# Patient Record
Sex: Female | Born: 1960
Health system: Southern US, Community
[De-identification: ages and names within clinical notes are randomized; demographics above are authoritative.]

## PROBLEM LIST (undated history)

## (undated) DIAGNOSIS — R928 Other abnormal and inconclusive findings on diagnostic imaging of breast: Secondary | ICD-10-CM

## (undated) DIAGNOSIS — E7800 Pure hypercholesterolemia, unspecified: Secondary | ICD-10-CM

## (undated) DIAGNOSIS — F329 Major depressive disorder, single episode, unspecified: Secondary | ICD-10-CM

## (undated) DIAGNOSIS — F3289 Other specified depressive episodes: Secondary | ICD-10-CM

## (undated) DIAGNOSIS — F17200 Nicotine dependence, unspecified, uncomplicated: Secondary | ICD-10-CM

## (undated) DIAGNOSIS — R030 Elevated blood-pressure reading, without diagnosis of hypertension: Secondary | ICD-10-CM

## (undated) DIAGNOSIS — D259 Leiomyoma of uterus, unspecified: Secondary | ICD-10-CM

## (undated) HISTORY — PX: BUNIONECTOMY: SHX129

## (undated) HISTORY — DX: Other abnormal and inconclusive findings on diagnostic imaging of breast: R92.8

## (undated) HISTORY — DX: Leiomyoma of uterus, unspecified: D25.9

## (undated) HISTORY — DX: Major depressive disorder, single episode, unspecified: F32.9

## (undated) HISTORY — DX: Pure hypercholesterolemia, unspecified: E78.00

## (undated) HISTORY — DX: Nicotine dependence, unspecified, uncomplicated: F17.200

## (undated) HISTORY — DX: Other specified depressive episodes: F32.89

## (undated) HISTORY — DX: Elevated blood-pressure reading, without diagnosis of hypertension: R03.0

## (undated) HISTORY — PX: PARTIAL HYSTERECTOMY: SHX80

## (undated) HISTORY — PX: BLADDER SUSPENSION: SHX72

---

## 2002-07-22 ENCOUNTER — Encounter (INDEPENDENT_AMBULATORY_CARE_PROVIDER_SITE_OTHER): Payer: Self-pay | Admitting: Internal Medicine

## 2002-07-22 ENCOUNTER — Other Ambulatory Visit: Admission: RE | Admit: 2002-07-22 | Discharge: 2002-07-22 | Payer: Self-pay | Admitting: Family Medicine

## 2003-12-03 ENCOUNTER — Ambulatory Visit: Payer: Self-pay | Admitting: Family Medicine

## 2003-12-15 ENCOUNTER — Ambulatory Visit: Payer: Self-pay | Admitting: Family Medicine

## 2004-02-10 ENCOUNTER — Ambulatory Visit: Payer: Self-pay | Admitting: Family Medicine

## 2004-02-15 ENCOUNTER — Ambulatory Visit: Payer: Self-pay | Admitting: Internal Medicine

## 2004-03-22 ENCOUNTER — Ambulatory Visit: Payer: Self-pay | Admitting: *Deleted

## 2004-04-05 ENCOUNTER — Ambulatory Visit: Payer: Self-pay | Admitting: Cardiovascular Disease

## 2004-06-27 ENCOUNTER — Ambulatory Visit: Payer: Self-pay | Admitting: Cardiology

## 2004-08-16 ENCOUNTER — Ambulatory Visit: Payer: Self-pay | Admitting: *Deleted

## 2004-08-16 ENCOUNTER — Ambulatory Visit: Payer: Self-pay | Admitting: Cardiology

## 2004-10-13 ENCOUNTER — Ambulatory Visit: Payer: Self-pay | Admitting: Family Medicine

## 2004-11-15 ENCOUNTER — Ambulatory Visit: Payer: Self-pay | Admitting: Cardiology

## 2004-12-02 ENCOUNTER — Ambulatory Visit: Payer: Self-pay | Admitting: Family Medicine

## 2005-06-12 ENCOUNTER — Ambulatory Visit: Payer: Self-pay | Admitting: Family Medicine

## 2005-06-13 ENCOUNTER — Ambulatory Visit: Payer: Self-pay | Admitting: Family Medicine

## 2005-07-11 ENCOUNTER — Encounter: Payer: Self-pay | Admitting: Family Medicine

## 2005-07-11 ENCOUNTER — Ambulatory Visit: Payer: Self-pay | Admitting: Family Medicine

## 2005-08-29 ENCOUNTER — Ambulatory Visit: Payer: Self-pay | Admitting: Family Medicine

## 2006-01-23 HISTORY — PX: VAGINAL HYSTERECTOMY: SUR661

## 2006-02-20 ENCOUNTER — Ambulatory Visit: Payer: Self-pay | Admitting: Family Medicine

## 2006-03-12 ENCOUNTER — Ambulatory Visit: Payer: Self-pay | Admitting: Gynecology

## 2006-03-13 ENCOUNTER — Ambulatory Visit: Payer: Self-pay | Admitting: Family Medicine

## 2006-03-15 ENCOUNTER — Ambulatory Visit: Payer: Self-pay | Admitting: Gynecology

## 2006-03-20 ENCOUNTER — Ambulatory Visit (HOSPITAL_COMMUNITY): Admission: RE | Admit: 2006-03-20 | Discharge: 2006-03-20 | Payer: Self-pay | Admitting: Gynecology

## 2006-05-15 ENCOUNTER — Ambulatory Visit: Payer: Self-pay | Admitting: Family Medicine

## 2006-06-15 ENCOUNTER — Ambulatory Visit: Payer: Self-pay | Admitting: Internal Medicine

## 2006-06-15 DIAGNOSIS — F3289 Other specified depressive episodes: Secondary | ICD-10-CM | POA: Insufficient documentation

## 2006-06-15 DIAGNOSIS — F329 Major depressive disorder, single episode, unspecified: Secondary | ICD-10-CM | POA: Insufficient documentation

## 2006-07-16 ENCOUNTER — Telehealth (INDEPENDENT_AMBULATORY_CARE_PROVIDER_SITE_OTHER): Payer: Self-pay | Admitting: *Deleted

## 2006-08-15 ENCOUNTER — Encounter (INDEPENDENT_AMBULATORY_CARE_PROVIDER_SITE_OTHER): Payer: Self-pay | Admitting: Internal Medicine

## 2006-08-15 DIAGNOSIS — F17200 Nicotine dependence, unspecified, uncomplicated: Secondary | ICD-10-CM | POA: Insufficient documentation

## 2006-08-20 ENCOUNTER — Ambulatory Visit: Payer: Self-pay | Admitting: Family Medicine

## 2006-08-20 DIAGNOSIS — L909 Atrophic disorder of skin, unspecified: Secondary | ICD-10-CM | POA: Insufficient documentation

## 2006-08-20 DIAGNOSIS — L919 Hypertrophic disorder of the skin, unspecified: Secondary | ICD-10-CM

## 2006-08-21 ENCOUNTER — Encounter: Payer: Self-pay | Admitting: Family Medicine

## 2006-08-21 DIAGNOSIS — R928 Other abnormal and inconclusive findings on diagnostic imaging of breast: Secondary | ICD-10-CM | POA: Insufficient documentation

## 2006-10-09 ENCOUNTER — Encounter: Payer: Self-pay | Admitting: Family Medicine

## 2006-10-09 ENCOUNTER — Ambulatory Visit: Payer: Self-pay | Admitting: Family Medicine

## 2006-10-12 ENCOUNTER — Encounter (INDEPENDENT_AMBULATORY_CARE_PROVIDER_SITE_OTHER): Payer: Self-pay | Admitting: *Deleted

## 2007-02-22 ENCOUNTER — Ambulatory Visit: Payer: Self-pay | Admitting: Family Medicine

## 2007-02-22 DIAGNOSIS — N951 Menopausal and female climacteric states: Secondary | ICD-10-CM | POA: Insufficient documentation

## 2007-02-22 DIAGNOSIS — R1031 Right lower quadrant pain: Secondary | ICD-10-CM | POA: Insufficient documentation

## 2007-02-22 DIAGNOSIS — I10 Essential (primary) hypertension: Secondary | ICD-10-CM | POA: Insufficient documentation

## 2007-02-22 DIAGNOSIS — R030 Elevated blood-pressure reading, without diagnosis of hypertension: Secondary | ICD-10-CM | POA: Insufficient documentation

## 2007-02-25 ENCOUNTER — Ambulatory Visit: Payer: Self-pay | Admitting: Family Medicine

## 2007-02-26 ENCOUNTER — Ambulatory Visit: Payer: Self-pay | Admitting: Cardiovascular Disease

## 2007-02-26 LAB — CONVERTED CEMR LAB
ALT: 15 units/L (ref 0–35)
AST: 13 units/L (ref 0–37)
Chloride: 104 meq/L (ref 96–112)
Cholesterol: 291 mg/dL (ref 0–200)
Creatinine, Ser: 0.9 mg/dL (ref 0.4–1.2)
FSH: 11.3 milliintl units/mL
GFR calc Af Amer: 87 mL/min
GFR calc non Af Amer: 72 mL/min
Glucose, Bld: 107 mg/dL — ABNORMAL HIGH (ref 70–99)
HCT: 40.9 % (ref 36.0–46.0)
HDL: 28.4 mg/dL — ABNORMAL LOW (ref >39.0)
MCHC: 33.4 g/dL (ref 30.0–36.0)
MCV: 89.8 fL (ref 78.0–100.0)
Monocytes Relative: 8.5 % (ref 3.0–11.0)
Neutro Abs: 5.1 10*3/uL (ref 1.4–7.7)
Neutrophils Relative %: 52.3 % (ref 43.0–77.0)
Platelets: 300 10*3/uL (ref 150–400)
RBC: 4.55 M/uL (ref 3.87–5.11)
RDW: 12.8 % (ref 11.5–14.6)
TSH: 2.73 microintl units/mL (ref 0.35–5.50)
Total CHOL/HDL Ratio: 10.2
Total Protein: 6.6 g/dL (ref 6.0–8.3)
Triglycerides: 613 mg/dL (ref 0–149)

## 2007-03-01 ENCOUNTER — Telehealth (INDEPENDENT_AMBULATORY_CARE_PROVIDER_SITE_OTHER): Payer: Self-pay | Admitting: Internal Medicine

## 2007-03-01 ENCOUNTER — Encounter (INDEPENDENT_AMBULATORY_CARE_PROVIDER_SITE_OTHER): Payer: Self-pay | Admitting: Internal Medicine

## 2007-03-01 DIAGNOSIS — D259 Leiomyoma of uterus, unspecified: Secondary | ICD-10-CM | POA: Insufficient documentation

## 2007-03-05 ENCOUNTER — Encounter (INDEPENDENT_AMBULATORY_CARE_PROVIDER_SITE_OTHER): Payer: Self-pay | Admitting: *Deleted

## 2007-03-25 ENCOUNTER — Encounter (INDEPENDENT_AMBULATORY_CARE_PROVIDER_SITE_OTHER): Payer: Self-pay | Admitting: Gynecology

## 2007-03-25 ENCOUNTER — Ambulatory Visit: Payer: Self-pay | Admitting: Gynecology

## 2007-03-26 ENCOUNTER — Telehealth (INDEPENDENT_AMBULATORY_CARE_PROVIDER_SITE_OTHER): Payer: Self-pay | Admitting: Internal Medicine

## 2007-05-21 ENCOUNTER — Encounter (INDEPENDENT_AMBULATORY_CARE_PROVIDER_SITE_OTHER): Payer: Self-pay | Admitting: Gynecology

## 2007-05-21 ENCOUNTER — Ambulatory Visit (HOSPITAL_COMMUNITY): Admission: RE | Admit: 2007-05-21 | Discharge: 2007-05-22 | Payer: Self-pay | Admitting: Gynecology

## 2007-05-21 ENCOUNTER — Ambulatory Visit: Payer: Self-pay | Admitting: Gynecology

## 2007-05-27 ENCOUNTER — Ambulatory Visit: Payer: Self-pay | Admitting: Gynecology

## 2007-07-01 ENCOUNTER — Ambulatory Visit: Payer: Self-pay | Admitting: Gynecology

## 2007-09-11 ENCOUNTER — Telehealth (INDEPENDENT_AMBULATORY_CARE_PROVIDER_SITE_OTHER): Payer: Self-pay | Admitting: Internal Medicine

## 2007-09-20 ENCOUNTER — Encounter (INDEPENDENT_AMBULATORY_CARE_PROVIDER_SITE_OTHER): Payer: Self-pay | Admitting: Internal Medicine

## 2007-09-20 ENCOUNTER — Ambulatory Visit: Payer: Self-pay | Admitting: Family Medicine

## 2007-09-24 ENCOUNTER — Encounter (INDEPENDENT_AMBULATORY_CARE_PROVIDER_SITE_OTHER): Payer: Self-pay | Admitting: *Deleted

## 2007-09-24 ENCOUNTER — Encounter (INDEPENDENT_AMBULATORY_CARE_PROVIDER_SITE_OTHER): Payer: Self-pay | Admitting: Internal Medicine

## 2008-10-27 ENCOUNTER — Telehealth (INDEPENDENT_AMBULATORY_CARE_PROVIDER_SITE_OTHER): Payer: Self-pay | Admitting: Internal Medicine

## 2008-11-20 ENCOUNTER — Ambulatory Visit: Payer: Self-pay | Admitting: Family Medicine

## 2008-11-20 ENCOUNTER — Encounter (INDEPENDENT_AMBULATORY_CARE_PROVIDER_SITE_OTHER): Payer: Self-pay | Admitting: Internal Medicine

## 2008-11-25 ENCOUNTER — Encounter (INDEPENDENT_AMBULATORY_CARE_PROVIDER_SITE_OTHER): Payer: Self-pay | Admitting: *Deleted

## 2009-02-08 ENCOUNTER — Ambulatory Visit: Payer: Self-pay | Admitting: Obstetrics and Gynecology

## 2009-02-08 ENCOUNTER — Encounter: Payer: Self-pay | Admitting: Family Medicine

## 2009-02-08 LAB — CONVERTED CEMR LAB: GC Probe Amp, Urine: NEGATIVE

## 2009-04-30 ENCOUNTER — Ambulatory Visit: Payer: Self-pay | Admitting: Family Medicine

## 2009-04-30 ENCOUNTER — Telehealth: Payer: Self-pay | Admitting: Family Medicine

## 2009-04-30 DIAGNOSIS — N390 Urinary tract infection, site not specified: Secondary | ICD-10-CM | POA: Insufficient documentation

## 2009-04-30 LAB — CONVERTED CEMR LAB
Bilirubin Urine: NEGATIVE
Casts: 0 /lpf
Glucose, Urine, Semiquant: NEGATIVE
Ketones, urine, test strip: NEGATIVE
Nitrite: NEGATIVE
Specific Gravity, Urine: 1.02
Urobilinogen, UA: 0.2
Yeast, UA: 0

## 2009-11-11 ENCOUNTER — Telehealth: Payer: Self-pay | Admitting: Family Medicine

## 2009-12-27 ENCOUNTER — Ambulatory Visit: Payer: Self-pay | Admitting: Family Medicine

## 2009-12-27 ENCOUNTER — Encounter: Payer: Self-pay | Admitting: Family Medicine

## 2009-12-27 LAB — HM MAMMOGRAPHY: HM Mammogram: NORMAL

## 2009-12-28 ENCOUNTER — Encounter (INDEPENDENT_AMBULATORY_CARE_PROVIDER_SITE_OTHER): Payer: Self-pay | Admitting: *Deleted

## 2010-02-22 NOTE — Miscellaneous (Signed)
  Clinical Lists Changes  Observations: Added new observation of MAMMO DUE: 12/28/2010 (12/27/2009 16:44) Added new observation of MAMMOGRAM: Normal (12/27/2009 16:44)

## 2010-02-22 NOTE — Letter (Signed)
Summary: Results Follow up Letter  [facility]  [address] [place], [place] [address]   Phone: [phone]  Fax: [phone]    12/28/2009 MRN: [id number]    [name] [address] [place], [place]  [address]    Dear Ms. Brekke,  The following are the results of your recent test(s):  Test         Result    Pap Smear:        Normal _____  Not Normal _____ Comments: ______________________________________________________ Cholesterol: LDL(Bad cholesterol):         Your goal is less than:         HDL (Good cholesterol):       Your goal is more than: Comments:  ______________________________________________________ Mammogram:        Normal __X___  Not Normal _____ Comments:  Yearly follow up is recommended.   ___________________________________________________________________ Hemoccult:        Normal _____  Not normal _______ Comments:    _____________________________________________________________________ Other Tests:    We routinely do not discuss normal results over the telephone.  If you desire a copy of the results, or you have any questions about this information we can discuss them at your next office visit.   Sincerely,    Dwana Curd. Para March, M.D.  [facility]

## 2010-02-22 NOTE — Progress Notes (Signed)
Summary: ? UTI  Phone Note Call from Patient Call back at Home Phone [phone]   Caller: Patient Call For: Dr. Milinda Antis Summary of Call: Patient is complaining of pressure upon urination, no pain, no burning.  She called on yesterday then decided to just drink lots of water and Cranberry juice to see if that helped and it did not.  Wants to know if she needs to be seen or if she can just come by and drop off a urine specimen.  Please advise.  Uses [facility] Initial call taken by: Linde Gillis CMA Duncan Dull),  April 30, 2009 9:19 AM  Follow-up for Phone Call        please drop off specimen when able- alert me when it gets processed thanks  Follow-up by: Judith Part MD,  April 30, 2009 10:20 AM  Additional Follow-up for Phone Call Additional follow up Details #1::        Patient notified as instructed by telephone.  Pt will be here today before 4:30pm.Rena Isley LPN  April 30, 4780 1:01 PM   Pt got urine specimen and said still no pain; just pressure feeling but when finishes urinating still feels like needs to urinate more. Pt can be reached at [phone] and [facility] if needed.Lewanda Rife LPN  April 30, 9560 4:18 PM   New Problems: UTI (ICD-599.0)   Additional Follow-up for Phone Call Additional follow up Details #2::    Patient notified as instructed by telephone. Medication phoned to [facility] as instructed. Lewanda Rife LPN  May 01, 1306 4:39 PM   New Problems: UTI (ICD-599.0) New/Updated Medications: SEPTRA DS 800-160 MG TABS (SULFAMETHOXAZOLE-TRIMETHOPRIM) 1 by mouth two times a day for 3 days Prescriptions: SEPTRA DS 800-160 MG TABS (SULFAMETHOXAZOLE-TRIMETHOPRIM) 1 by mouth two times a day for 3 days  #6 x 0   Entered and Authorized by:   Judith Part MD   Signed by:   Lewanda Rife LPN on 65/78/4696   Method used:   Telephoned to ...         RxID:   [id number]   Laboratory Results   Urine Tests  Date/Time Received: April 30, 2009  4:16 PM  Date/Time Reported: April 30, 2009 4:16 PM   Routine Urinalysis   Color: yellow Appearance: Cloudy Glucose: negative   (Normal Range: Negative) Bilirubin: negative   (Normal Range: Negative) Ketone: negative   (Normal Range: Negative) Spec. Gravity: 1.020   (Normal Range: 1.003-1.035) Blood: trace-intact   (Normal Range: Negative) pH: 5.0   (Normal Range: 5.0-8.0) Protein: trace   (Normal Range: Negative) Urobilinogen: 0.2   (Normal Range: 0-1) Nitrite: negative   (Normal Range: Negative) Leukocyte Esterace: small   (Normal Range: Negative)  Urine Microscopic WBC/HPF: 2-4 RBC/HPF: 0 Bacteria/HPF: mild Mucous/HPF: few Epithelial/HPF: 1 Crystals/HPF: 0 Casts/LPF: 0 Yeast/HPF: 0 Other: 0    Comments: urine is slt positive will send for cx enc good fluids - if worse / call or seek care - esp if back pain or fever px written on EMR for call in for septra ds      Appended Document: Orders Update    Clinical Lists Changes  Orders: Added new Service order of UA Dipstick W/ Micro (manual) (25366) - Signed

## 2010-02-22 NOTE — Progress Notes (Signed)
Summary: Pt wants  mammogram appt  Phone Note Call from Patient Call back at [phone]   Caller: Patient Call For: Dr Roxy Manns Summary of Call: Pt said it is time for her mammogram and would like referral to [facility]. Pt can only go on certain days. Pt can go on Monday, 11/15/09 and every other Monday after the 24th.  Pt will wait to hear from pt care coordinator about her appt. Pt can be reached  at [phone]. Please advise.  Initial call taken by: Lewanda Rife LPN,  November 11, 2009 3:31 PM  Follow-up for Phone Call        will do ref for [facility] Follow-up by: Judith Part MD,  November 11, 2009 4:48 PM  New Problems: OTHER SCREENING MAMMOGRAM (ICD-V76.12)   New Problems: OTHER SCREENING MAMMOGRAM (ICD-V76.12)

## 2010-06-07 NOTE — Assessment & Plan Note (Signed)
NAMEGRACY, [name]                  ACCOUNT NO.:  [id number]   MEDICAL RECORD NO.:  [id number]          PATIENT TYPE:  POB   LOCATION:  [facility]         FACILITY:  [facility]   PHYSICIAN:  Argentina Donovan, MD        DATE OF BIRTH:  October 18, 1960   DATE OF SERVICE:  02/08/2009                                  CLINIC NOTE   The patient is a 50 year old Caucasian female who underwent laparoscopic-  assisted vaginal hysterectomy with the left salpingo-oophorectomy,  preservation of the right tube and ovary, and TVT procedure with  cystoscopy in May 2009.  She has done extremely well since then.  She  takes no medication.  She has been trying to lose weight.  Her weight a  year ago was 224, and she is down this year to 4.  She has been doing  exercise and dieting.  She was on fish oil, but that caused her to have  heartburn, and she could not take any of the cholesterol drugs because  of the muscle pains that it caused.  She has no physical complaints at  the present time.   ALLERGIES:  PENICILLIN and NSAIDs.   REVIEW OF SYSTEMS:  Negative with the exception of present illness.   PHYSICAL EXAMINATION:  GENERAL:  Well-developed, slightly obese white  female in no acute distress.  VITAL SIGNS:  5 feet 8 inches tall, 204 pounds, blood pressure 118/85,  pulse is 89 per minute.  HEENT:  Within normal limits.  NECK:  Supple.  Thyroid, symmetrical, no masses.  LUNGS:  Clear to auscultation and percussion.  HEART:  No murmur.  Normal sinus rhythm.  BACK:  Erect.  No CVA tenderness.  BREASTS:  Symmetrical, no dominant masses.  No nipple discharge.  ABDOMEN:  Soft, flat, nontender.  No masses or organomegaly.  HEART:  No murmur.  Normal sinus rhythm.  PMI in the 5th ICS and MCL.  EXTREMITIES:  No edema.  No varicosities.  GENITALIA:  External genitalia is normal.  BUS within normal limits.  Vagina is clean and well rugated, the vagina is status hysterectomy.  Bimanual examination, the right  ovary could not be palpated.  Rectal  exam was negative.           ______________________________  Argentina Donovan, MD     PR/MEDQ  D:  02/08/2009  T:  02/09/2009  Job:  [id number]

## 2010-06-07 NOTE — Op Note (Signed)
[name], [name]                  ACCOUNT NO.:  [id number]   MEDICAL RECORD NO.:  [id number]          PATIENT TYPE:  OIB   LOCATION:  9319                          [facility]ACILITY:  [facility]   PHYSICIAN:  Ginger Carne, MD  DATE OF BIRTH:  [birth year]   DATE OF PROCEDURE:  DATE OF DISCHARGE:                               OPERATIVE REPORT   PREOPERATIVE DIAGNOSES:  Genuine urinary stress incontinence,  menorrhagia, dysmenorrhea, and 10-week leiomyomatous uterus.   POSTOPERATIVE DIAGNOSIS:  Genuine urinary stress incontinence,  menorraghia, dysmenorrhea and 12-week leiomyomatous uterus and stage I  endometriosis.   PROCEDURE:  Laparoscopic-assisted vaginal hysterectomy, left salpingo-  oophorectomy with preservation of right tube and ovary, tension-free  vaginal tape procedure with cystoscopy and indigo-carmine insufflation.   SURGEON:  Ginger Carne, MD.   ASSISTANTMichele Mcalpine D. Rose, M.D.   ESTIMATED BLOOD LOSS:  250 mL.   COMPLICATIONS:  None immediate.   ANESTHESIA:  General.   SPECIMEN:  Uterus, cervix, left tube, and ovary.   OPERATIVE FINDINGS:  External genitalia, vulva, and vagina. Normal  cervix, smooth without erosions or lesions.  Laparoscopic evaluation  reveals stage I endometriosis confined to adhesive disease of the left  adnexa and left pelvic sidewall, but free of the cul-de-sac and the  right tube and ovary.  The appendix appeared normal.   During the course of the TVT portion of the procedure, cystoscopy was  performed.  No injury or violation of the bladder noted after placement  of the TVT tape.  The dome of the bladder was visualized including  lateral walls and posterior wall and the urethra and indigo-carmine  insufflation performed revealed good spillage of dye through both  ureteral orifices.   Laparoscopic evaluation also demonstrated normal large and small bowel,  upper abdomen was unremarkable.   OPERATIVE PROCEDURE:  The patient was prepped and  draped in the usual  fashion and placed in the lithotomy position.  Betadine solution used  for antiseptic and the patient was catheterized prior to procedure.  After adequate general anesthesia,  a tenaculum was placed on the  anterior lip of the cervix and a Pelosi uterine manipulator placed in  the endocervical canal.  Afterwards, a vertical infraumbilical incision  was made.  The Veress needle placed in the abdomen.  Opening and closing  pressures were 10-15 mmHg.  Needle released, trocar placed in same  incision.  Laparoscope placed in trocar sleeve, two 5-mm ports were made  in the left lower quadrant, and left hypogastric regions under direct  visualization.  Following this, the right tube and right round ligament  were bipolar cauterized and cut.  On the left side, the left  infundibulopelvic ligament was bipolar cauterized and cut and this  extended to the left round ligament.  Afterwards the bleeding points  were hemostatically checked and attention was directed to the vaginal  portion of the procedure.  Marcaine with epinephrine was injected  circumferentially around the cervix.  Afterwards 2 cm of anterior and  posterior vaginal epithelium were incised transversely.  The cardinal  ligament and uterosacral  ligament complexes were then clamped, cut, and  ligated with 0-Vicryl suture.  The peritoneal reflections anteriorly and  posteriorly were then identified after dissection and opened without  injury to the bladder or the rectum.  At this point, in a standard  Richardson fashion, the uterine vasculature was clamped, cut, and  ligated with 0-Vicryl suture including the ascending branches.  The  uterus was then wedged in cord and the uterus and cervix in addition to  the left tube and ovary were then removed.  Bleeding points  hemostatically checked.  Closure of the cuff with 0-Vicryl running  interlocking suture.  Relaparoscopying the patient revealed no active  bleeding;  however, bleeding points were hemostatically checked as  necessary.  Copious irrigation, lactated Ringer's was utilized and  irrigant removed.  Afterwards gas released, trocars removed.  Closure of  the 10-mm fascia site with 0-Vicryl suture, 4-0 Vicryl for subcuticular  closure.   TVT portion of the procedure followed. The anterior vaginal epithelium  was incised for about 3-4 cm in the midline.  The pubovesical cervical  fascia was then dissected off the vaginal wall.  The space of Retzius  was identified using a bottom-up [place] scientific technique, the TVT  trocars were placed and going through the space of Retzius hugging the  posterior aspect of the pubic bone and emanating 1-2 cm laterally to the  midline.  Immediate cystoscopy was then performed and findings described  as above.  No active bleeding noted.  In the space of Retzius, both  sides were dry.  Indigo carmine insufflation also followed and findings  described as above.  At this point, the bladder was emptied, filled with  250 mL of normal saline.  Appropriate tensioning of the TVT tape  followed and at this point, the sheaths were removed and the tape cut  below the skin.  The skin closed with Dermabond.  Bleeding points  hemostatically checked.  More irrigation utilized and closure of the  vaginal epithelium with 0-Vicryl running interlocking suture.  The tape  was placed in the mid urethral position.  The patient tolerated the  procedure well, returned to post anesthesia recovery room in excellent  condition.  Urine clear at the end of the procedure.      Ginger Carne, MD  Electronically Signed     SHB/MEDQ  D:  05/21/2007  T:  05/22/2007  Job:  [id number]

## 2010-06-07 NOTE — Discharge Summary (Signed)
[name], SIEVERT                  ACCOUNT NO.:  [id number]   MEDICAL RECORD NO.:  [id number]          PATIENT TYPE:  OIB   LOCATION:  9319                          [facility]ACILITY:  [facility]   PHYSICIAN:  Ginger Carne, MD  DATE OF BIRTH:  April 11, 1960   DATE OF ADMISSION:  05/21/2007  DATE OF DISCHARGE:                               DISCHARGE SUMMARY   REASON FOR HOSPITALIZATION:  1. Genuine urinary stress incontinence.  2. Menorrhagia with dysmenorrhea.  3. A 10-week size leiomyomatous uterus.   POSTOPERATIVE DIAGNOSIS:  1. Genuine urinary stress incontinence.  2. Menorrhagia with dysmenorrhea.  3. A 12-week size leiomyomatous uterus.  4. Stage I endometriosis.   IN-HOSPITAL PROCEDURES:  1. Laparoscopic-assisted vaginal hysterectomy and left salpingo-      oophorectomy.  2. Tension-free vaginal tape procedure with cystoscopy and indigo      carmine insufflation.   HOSPITAL COURSE:  This patient is a 50 year old Caucasian female who  underwent the aforementioned procedures on May 21, 2007.  Her  intraoperative course was uneventful.  She had an appropriate workup  prior to her surgery.  Postoperatively, she was afebrile.  Her  hemoglobin was 11.0 from a preop of 13.9 and creatinine postop of 0.81.  Her abdomen was soft.  Her incisions were dry.  She has scant vaginal  flow.  Calfs were without tenderness.  Her lungs were clear.   The patient had her Foley catheter removed in the early morning and is  awaiting voiding trails.  Her residual urine volume is grater than 60  mL.  Foley catheter will be replaced with a leg bag and will return on  May 27, 2007, to have apparatus removed.  In the event that she is able  to void, she was instructed to do tiny voids every 4 hours.  Routine  postoperative instructions were provided including contacting the office  with temperature elevation grater than 100.4 degrees Fahrenheit,  increasing abdominal or incisional pain or drainage, vaginal  bleeding,  constipation, or urinary retention, urgency, difficulties, or any other  complaints.  If the patient is able to void and does not live with the  catheter, she will return to the office in 3-4 weeks at [facility].  She was prescribed Cipro 500 mg 1 twice a day for 7 days and Mepergan  Fortis 1-2 every 6-8 hours as needed for postoperative pain.  The  patient verbalized understanding of all instructions, and all questions  were answered to the satisfaction of the patient.      Ginger Carne, MD  Electronically Signed     SHB/MEDQ  D:  05/22/2007  T:  05/22/2007  Job:  [id number]

## 2010-06-07 NOTE — Assessment & Plan Note (Signed)
NAMESHYNICE, SIGEL                  ACCOUNT NO.:  [id number]   MEDICAL RECORD NO.:  [id number]          PATIENT TYPE:  POB   LOCATION:  [facility]         FACILITY:  [facility]   PHYSICIAN:  Ginger Carne, MD DATE OF BIRTH:  30-Jul-1960   DATE OF SERVICE:  07/01/2007                                  CLINIC NOTE   HISTORY:  Ms. Rawl is here today for her postoperative evaluation.  The  patient had a laparoscopic-assisted vaginal hysterectomy, left salpingo-  oophorectomy with preservation of right tube and ovary, and a TVT  procedure with cystoscopy and indigo carmine insufflation.   Her preoperative and postoperative diagnoses are genuine urinary stress  incontinence, menorrhagia, dysmenorrhea, and a 12-week leiomyomatous  uterus with stage I endometriosis.   Her postoperative course has been uneventful since her surgery dated  05/21/2007.  The patient denies symptomatology related to urgency, loss  of urine, or urinary retention.  She denies vaginal discharge.   EXAMINATION:  Abdomen soft.  Abdominal incisions are dry and clean and  well-healed.  External genitalia, vulva, and vagina are normal.  Well-  healed anterior wall in the midline as well as the cuff.  There is no  evidence of hematoma.   Pathology report returned with benign cervix, adenomyosis and clinical  weight of the uterus of 155 grams.  Left ovary and tube were just  benign.   PLAN:  The patient was told she could resume all activities.  We  discussed issues related to constipation and she will take Citrucel  daily and increase her fluid intake if necessary.  She will return on a  p.r.n. basis and was asked that she contact the office if she has  difficulties with voiding including urgency, urinary retention, or  recurrent urinary stress incontinence.  The patient had no further  questions and was happy with her surgical outcome.           ______________________________  Ginger Carne,  MD     SHB/MEDQ  D:  07/01/2007  T:  07/02/2007  Job:  [id number]

## 2010-06-07 NOTE — Group Therapy Note (Signed)
[name], ORLOV NO.:  [id number]   MEDICAL RECORD NO.:  [id number]          PATIENT TYPE:  POB   LOCATION:  [facility]                   FACILITY:  [facility]   PHYSICIAN:  Ginger Carne, MD DATE OF BIRTH:  1960-11-17   DATE OF SERVICE:  03/25/2007                                  CLINIC NOTE   This patient is a 50 year old multiparous female who is referred by  Everrett Coombe, FNP, because of pelvic fullness, difficulty inserting in  tampon, and lower abdominal pain with menometrorrhagia.  She is a G2,  para 1-0-1-1.  Over the past year she has noted increasing menses  lasting 7-10 days without intermenstrual spotting or bleeding.  She  takes no medications to enhance her bleeding propensity and has no  personal family history of bleeding diatheses her menses occur about  every 30 days.  She complains of intermenstrual cramping, pain, and  dyspareunia.  She does not have intermenstrual bleeding.  She states  that when she places a tampon inside her vagina, she notices that upon  standing, her tampon comes to her introitus and is uncomfortable.   The patient states that she loses urine with coughing, straining, and  other Valsalva maneuvers consistent with genuine urinary stress  incontinence.  She denies symptoms of an overactive bladder or  interstitial cystitis.  Specifically she denies urgency, nocturia, or  postvoid dribbling.  She does not take medications to enhance her loss  of urine and has no fecal incontinence.  The patient has had a pelvic  sonogram which demonstrated in February of 2008, 3 small intramural  fibroids measuring about 2 cm with normal ovaries.  A CT scan of the  pelvis performed in January 2009 was consistent with her previous  ultrasound and without other findings.  Of note is a right inguinal  hernia noted on imaging.  The patient is asymptomatic, however.   SALIENT PHYSICAL FINDINGS:  Blood pressure 117/85, weight 224 pounds,  height 5 feet 8 inches.  Abdomen is soft without gross hepatosplenomegaly.  On his recumbent and  standing position, the patient does not demonstrate clinical evidence  for Direct or indirect hernia.  PELVIC:  External genitalia, vulva and vagina are normal in the supine  position.  Pap smear obtained and endometrial biopsy performed.  Biopsy  sounded to 9 cm.  The uterus is tender.  Both adnexa are slightly tender  without enlargement.  Rectovaginal exam is confirmatory.  Upon standing,  the patient is noted to have second-degree utero-ovarian prolapse.   Residual urine volume is 8 mL with a positive cough test sign.  There is  no demonstration of cystocele or rectocele.  Urinalysis is normal.   IMPRESSION:  1. Menometrorrhagia.  2. Genuine urinary stress incontinence.  3. Chronic pelvic pain for 1 year.   PLAN:  Due to the combined symptomatology the patient presents with, I  am not certain that she would benefit from placing a Mirena IUD to  control her bleeding, and leave her symptoms of pelvic pain unaddressed.  Although it is unusual to see endometriosis present in ladies in  the  early 83s, it is not unheard of and certainly cannot be ruled out.  This  is despite a normal pelvic sonogram and CT scan.  Her difficulties with  placement of tampon are related to a second-degree uterovaginal  prolapse, although apart from placement of the tampon, she has no other  symptoms related to this.  Her genuine urinary stress incontinence has  worsened over several years and states that has been both personally and  socially embarrassing and wishes to have correction of same.  For these  reasons I offered the patient several options.  One, was to either place  a Mirena IUD or perform a NovaSure endometrial ablation to control  bleeding and perform a TVT (tension-free vaginal tape procedure and  cystoscopy for incontinence).  However, this again would not address her  pelvic pain which she  feels needs to be dealt with.  For this reason,  she is in agreement and I will proceed with a laparoscopic-assisted  vaginal hysterectomy, and depending on pathology observed, will perform  a unilateral or bilateral salpingo-oophorectomy and possible  appendectomy depending if endometriosis was noted.  If not both ovaries  will remain in place.  A hysterectomy will be performed with possible  suspension of the vagina to the uterosacral ligaments which can be  performed vaginally.  In addition, TVT procedure will be performed to  address her genuine urinary stress incontinence.  She is pleased with  this approach and understands that the more conservative options are  still on the table which, again, will not address her pain which seems  to have worsened over time.  Ashby Dawes of said procedures discussed in  detail including risks and benefits.  The patient, in addition,  demonstrates no genitourinary, gastrointestinal, or neurological or  musculoskeletal sources for her pain.           ______________________________  Ginger Carne, MD     SHB/MEDQ  D:  03/25/2007  T:  03/25/2007  Job:  [id number]   cc:   Everrett Coombe, FNP  [facility]

## 2010-06-07 NOTE — H&P (Signed)
[name], [name]                  ACCOUNT NO.:  [id number]   MEDICAL RECORD NO.:  [id number]          PATIENT TYPE:  AMB   LOCATION:                                FACILITY:  [facility]   PHYSICIAN:  Ginger Carne, MD  DATE OF BIRTH:  02-15-1960   DATE OF ADMISSION:  05/21/2007  DATE OF DISCHARGE:                              HISTORY & PHYSICAL   HISTORY OF PRESENT ILLNESS:  This is a 50 year old gravida 2, para 1-0-1-  1 female who has developed a one to two year history of pelvic fullness,  difficulty inserting a tampon and lower abdominal pain with  menometrorrhagia.  Over the past year she had noted increasing menses  lasting 7-10 days without intermenstrual spotting or bleeding.  She  takes no medications to enhance her bleeding propensity and has no  personal or family history of bleeding diatheses.  She complains of  intermenstrual cramping, pain and dyspareunia.  She denies  intermenstrual bleeding.  The patient states that she has also had  dyspareunia at times whereby she is unable to have intercourse.   Pelvic ultrasound demonstrates a uterus with a sagittal length of 9 cm.  Three distinct intramural fibroids are noted in the uterine body.  Both  ovaries are normal.  Ultrasound dated March 20, 2006.  An endometrial  biopsy obtained on March 26, 2007, demonstrated benign proliferative  endometrium without hyperplasia or neoplasia.   The patient states she loses urine with coughing, straining and other  Valsalva maneuvers consistent with genuine urinary stress incontinence.  She denies symptoms of an overactive bladder or interstitial cystitis.  She denies urgency, nocturia, postvoid dribbling or increased frequency.  She does not take medications to enhance her loss of urine and has no  history of fecal incontinence.  She has no known chronic diseases that  would precipitate incontinence.   OB/GYN HISTORY:  The patient has had 2 pregnancies in the past, 1 normal  vaginal  delivery and 1 first trimester incomplete abortion.   ALLERGIES:  PENICILLIN.   CURRENT MEDICATIONS:  None.   PAST SURGICAL HISTORY:  She had a foot operation in 2006.   PAST MEDICAL HISTORY:  Hypercholesterolemia and asthma.   SOCIAL HISTORY:  The patient smokes 1 pack of cigarettes a day.  Denies  alcohol or illicit drug abuse.   FAMILY HISTORY:  Her father has type 1 diabetes and has had a myocardial  infarction.  Her mother has coronary artery heart disease, hypertension  and breast cancer.   REVIEW OF SYSTEMS:  A 14 point comprehensive review of systems within  normal limits.   PHYSICAL EXAMINATION:  VITAL SIGNS:  Blood pressure 117/85, weight 224  pounds, height 5 feet 8 inches, pulse 85 and regular.  HEENT: Grossly normal.  BREAST:  Exam without masses, discharge, thickenings or tenderness.  CHEST:  Clear to percussion and auscultation.  CARDIOVASCULAR:  Exam without murmurs or enlargements.  Regular rate and  rhythm.  EXTREMITIES:  Within normal limits.  LYMPHATIC:  Within normal limits.  SKIN:  Within normal limits.  NEUROLOGICAL:  Within  normal limits.  MUSCULOSKELETAL:  Within normal limits.  ABDOMEN:  Soft without gross hepatosplenomegaly.  PELVIC:  External genitalia, vulva and vagina reveal no evidence of a  cystocele or a rectocele.  The uterus is tender on palpation.  On  standing the patient demonstrates a second degree utero-ovarian prolapse  which is not prominent in the supine recumbent position.  Both adnexa  are slightly tender without enlargements.  Uterus sounded to 9 cm on  endometrial biopsy consistent with transvaginal ultrasound.   Urinalysis is negative.  Residual urine volume 8 mL with a positive  cough sign noted.   IMPRESSION:  Menometrorrhagia and genuine urinary stress incontinence  and chronic pelvic pain for 1 year.   PLAN:  Due to the patient's symptomatology she was offered a Mirena IUD  to control her bleeding.  However, she  understands this would not  address her symptoms of pelvic pain or prolapse or dyspareunia.  She has  had a normal pelvic sonogram and a CT of the pelvis in the past.  The  patient has no desire for further childbearing (her partner has had a  vasectomy) and wishes definitive management for her abnormal bleeding  and discomfort in the pelvis.  She was offered a Mirena IUD.  She cannot  take oral contraceptives due to her smoking history.  She was also  offered an endometrial ablation which she also declined in addition to  the Mirena IUD.  For this reason the patient was in agreement and will  undergo a laparoscopic-assisted vaginal hysterectomy and depending on  pathology and findings will have a unilateral or bilateral salpingo-  oophorectomy with possible appendectomy depending on pathology and if  endometriosis is present.  She understands that if both ovaries are  normal and no pathology is evident both ovaries will remain in place.  If they are removed she will require estrogen replacement therapy for  which she was informed about. Should vault suspension be necessary, a  uterosacral ligament vaginal vault suspension  with cystoscopy will be performed.   The patient also has genuine urinary stress incontinence and a tension  free vaginal tape procedure will be performed to address her condition.  The nature of said procedure discussed in detail.   Risks for hysterectomy including injuries to ureter, bowel and bladder,  possible conversion to an open procedure, hemorrhage requiring a blood  transfusion, vaginal cuff or incisional infection and postoperative  complications were discussed and understood by said patient.   TVT complications discussed with the patient included postoperative  urinary retention, recurrent incontinence in the near future or later  on, urinary urgency and graft erosion, rejection or infection.  The  patient verbalized understanding of same and will be  scheduled for the  aforementioned procedure on May 21, 2007.      Ginger Carne, MD  Electronically Signed    SHB/MEDQ  D:  05/18/2007  T:  05/18/2007  Job:  [id number]

## 2010-06-10 NOTE — Assessment & Plan Note (Signed)
[name], [name]                  ACCOUNT NO.:  [id number]   MEDICAL RECORD NO.:  [id number]          PATIENT TYPE:  POB   LOCATION:  [facility] at [facility]         FACILITY:  [facility]   PHYSICIAN:  Tinnie Gens, MD        DATE OF BIRTH:  1960-01-25   DATE OF SERVICE:                                    CLINIC NOTE   CHIEF COMPLAINT:  Vulvar cyst.   A 50 year old gravida 2, para 1, who has previously been seen here with  incision and drainage of a vulvar cyst.  Continues to be a problem and has  flared up at least twice more. She has been able to drain it both times on  her own.  She like it to be removed so it does not continue to be a problem  for her.   PHYSICAL EXAMINATION:  VITAL SIGNS: __________  GENERAL:  No acute distress.  ABDOMEN: Soft, nontender.  GU: Normal external female genitalia.  On the right labia minora out toward  the urethral meatus, there is slight induration with pressure from pus  draining from the cyst.   IMPRESSION:  Vulvar cyst.   PLAN:  Surgical excision of the cyst.  Will schedule for Monday so the  patient can be back at work on Wednesday.           ______________________________  Tinnie Gens, MD     TP/MEDQ  D:  08/29/2005  T:  08/29/2005  Job:  [id number]

## 2010-06-10 NOTE — Group Therapy Note (Signed)
[name], PEPPERMAN NO.:  [id number]   MEDICAL RECORD NO.:  [id number]           PATIENT TYPE:   LOCATION:  [facility]                     FACILITY:   PHYSICIAN:  Tinnie Gens, MD             DATE OF BIRTH:   DATE OF SERVICE:  07/11/2005                                    CLINIC NOTE   [place]:   HISTORY OF PRESENT ILLNESS:  Patient is a 50 year old gravida 2, para 1 who  comes in today for a yearly examination.  She needs a Pap and a mammogram.  The patient was previously seen in May when she had vulvar inclusion cysts  that were incised and drained.  She reports that it has come back at least  once.  She actually drained it herself with a sterilized needle at the time.  She reports there is still a mild mass there but it is no longer tender or  inflamed.   The patient also reports very heavy periods for the last six months.  She  states that she had previously been told that this was because she was  getting closer to menopause.  Her mother went through menopause at age 32;  however, she continues to regular monthly cycles every 28 days; however,  they are filled with very heavy blood flow as well as passage of clots.  This has only been going on for the last six months.  Her past medical,  surgical, OB/GYN history, social history, family history, and review of  systems is again reviewed.  Please see visit from Jun 13, 2005 but there is  no significant change for this patient.   PHYSICAL EXAMINATION:  VITAL SIGNS:  Blood pressure 114/78, pulse 76, weight  216 pounds.  GENERAL:  She is a well-developed, well-nourished white female in no acute  distress.  LUNGS:  Clear bilaterally.  CV:  Regular rate and rhythm.  No murmurs, rubs, or gallops.  NECK:  Supple.  Normal thyroid.  SKIN:  She has multiple moles noted on her skin.  __________ about these.  ABDOMEN:  Soft, nontender, nondistended.  There is no organomegaly.  EXTREMITIES:  No clubbing,  cyanosis, edema.  She had 2+ distal pulses.  BREASTS:  Symmetric with everted nipples.  She has some fibrocystic change  in the outer and upper quadrants bilaterally.  There is no supraclavicular  or axillary adenopathy.  GENITOURINARY:  Normal external female genitalia.  There is still a flat,  fleshy mass in the patient's right labia minora but there is no induration  or inflammation noted.  The vagina is pink and rugated.  Cervix is parous  without lesion.  The uterus is possibly somewhat enlarged, although is  exquisitely tender and it made true examination somewhat difficulty plus  body habitus.  There was no adnexal mass or tenderness noted.   IMPRESSION:  1.  Gynecologic examination with Pap smear.  2.  Vulvar cyst.  3.  Abnormal uterine bleeding.   PLAN:  1.  Pap smear today.  2.  Pelvic ultrasound.  3.  Follow up one month for endometrial biopsy and sampling.  4.  Should the patient's vulvar cyst return, would take her to the OR to      excise the body of the cyst.           ______________________________  Tinnie Gens, MD     TP/MEDQ  D:  07/11/2005  T:  07/11/2005  Job:  [id number]

## 2010-06-10 NOTE — Assessment & Plan Note (Signed)
NAMECHARLCIE, [name]                  ACCOUNT NO.:  [id number]   MEDICAL RECORD NO.:  [id number]          PATIENT TYPE:  POB   LOCATION:  [facility]         [facility]ACILITY:  [facility]   PHYSICIAN:  Tinnie Gens, MD        DATE OF BIRTH:  04-05-1960   DATE OF SERVICE:  02/20/2006                                  CLINIC NOTE   CHIEF COMPLAINT:  Abnormal uterine bleeding.   HISTORY OF PRESENT ILLNESS:  The patient is a 50 year old gravida 2,  para 1 who was last seen for abnormal uterine bleeding in June of 2007.  She comes in today with continued bleeding for endometrial sampling.  She reports that her cycles vary from every 2 to 6 weeks with heavy  bleeding noted.  She wonders if she might be menopausal.  She is having  night sweats.   EXAM:  VITAL SIGNS:  As noted in the chart.  She is a well-developed, well-nourished female in no acute distress.  GU:  Normal external female genitalia.  BUS is normal.  The vagina is  pink and rugous.  Cervix is paras without lesions.   PROCEDURE:  The cervix is cleaned with Betadine x3.  The uterus is  sounded to approximately 8 cm.  Endometrial sampling is undertaken  without difficulty.  The patient tolerated the procedure well.   IMPRESSION:  Abnormal uterine bleeding.   PLAN:  1. Status post endometrial biopsy today, await results.  2. Pelvic sonogram.  3. She received FSH today.  4. Follow up in 2 weeks for results.           ______________________________  Tinnie Gens, MD     TP/MEDQ  D:  02/20/2006  T:  02/20/2006  Job:  [id number]

## 2010-06-10 NOTE — Group Therapy Note (Signed)
[name], Greer NO.:  [id number]   MEDICAL RECORD NO.:  [id number]          PATIENT TYPE:  WOC   LOCATION:  [facility]                   FACILITY:  [facility]   PHYSICIAN:  Tinnie Gens, MD        DATE OF BIRTH:  [birth year]   DATE OF SERVICE:  06/13/2005                                    CLINIC NOTE   CHIEF COMPLAINT:  Valvar cysts.   HISTORY OF PRESENT ILLNESS:  The patient is a 50 year old gravida 2, para 1,  who comes in today for vulvar lumps.  She apparently developed these  approximately 5 days ago and went to see her primary care physician, who  told her to do Sitz baths and that would take care of them; however, they  continued to get worse.  She is having trouble walking, standing and  sitting.  She reports that it is mostly on her right side, although it seems  to be at the top and the bottom.  She has a history of Bartholin's abscesses  prior to this, but they seem to be getting bigger and her symptoms are  definitely getting worse over the last 5 days.   PAST MEDICAL HISTORY:  1.  Asthma.  2.  Elevated cholesterol.   PAST SURGICAL HISTORY:  She had bunion surgery.   ALLERGIES:  PENICILLIN.   CURRENT MEDICATIONS:  Apple cider vinegar pills and fish oil pills.   OBSTETRICAL HISTORY:  She is a G2, P1, with 1 vaginal delivery 18 years ago.   GYNECOLOGICAL HISTORY:  Menarche at age 63.  Cycles are every 28 days.  Her  periods are getting heavier with moderate pain and very heavy flows.  They  are lasting longer, about twice as long as they normally do.  The patient's  partner had a vasectomy for birth control.  The patient has no history of  abnormal Pap.  Her last Pap smear was approximately 2 years ago.  Last  mammogram was 2 years ago.   FAMILY HISTORY:  Family history significant for diabetes, heart disease,  hypertension, breast cancer in her mother and her grandmother.   SOCIAL HISTORY:  She is a smoker of 1 pack per day for the past 20  years.  She is a social alcohol uses, no drugs.   REVIEW OF SYSTEMS:  Fourteen-point review of systems are reviewed.  Please  see GYN history in the chart, significant for muscle aches, fevers, night  sweats, fatigue, weight gain, frequent headaches, dizzy spells, problems  with breathing, shortness of breath, nausea, vomiting, loss of urine with  coughing and sneezing, and hot flashes.   PHYSICAL EXAMINATION:  VITAL SIGNS:  Her vitals as in the chart.  GENERAL:  She is a well-developed, well-nourished white female in no acute  distress.  ABDOMEN:  Soft, nontender and non-distended.  EXTREMITIES:  No cyanosis, clubbing or edema.  GU:  In the left labia minora, there is a fluctuant mass in the upper pole  of it with some sort of segmented band across the mid-portion and then  another swelling in the lower  pole of the labia minora.  Otherwise, BUS are  normal.  Vagina is pink and rugose.   PROCEDURE:  After cleaning with an alcohol swab, the top fluctuant mass is  infiltrated with 2 mL of 1% lidocaine with epinephrine and then the lower  mass is done as well.  A puncture wound is made with a 15 blade in the top  and the bottom with pus come out of the top mass.  Once the mass is drained,  a 4 x 4 is used to hold pressure there until hemostasis can be achieved.   IMPRESSION:  Bartholin's cyst abscess, status post incision and drainage.   PLAN:  Sitz baths.  Continue to let the area drain.  The patient will follow  up in 3-4 weeks for a CPE.  She does need Pap smear and mammogram update.  The patient also has heavy vaginal bleeding in a 50 year old who is a smoker  and has a family history of breast cancer.  The patient should have  endometrial sampling done to rule out endometrial hyperplasia or carcinoma  or polyp; this can be done at the time of her Pap smear visit.           ______________________________  Tinnie Gens, MD     TP/MEDQ  D:  06/13/2005  T:  06/14/2005  Job:   [id number]

## 2010-06-10 NOTE — Assessment & Plan Note (Signed)
[name], [name]                  ACCOUNT NO.:  [id number]   MEDICAL RECORD NO.:  [id number]          PATIENT TYPE:  POB   LOCATION:  [facility]         FACILITY:  [facility]   PHYSICIAN:  Tinnie Gens, MD        DATE OF BIRTH:  03-Sep-1960   DATE OF SERVICE:                                  CLINIC NOTE   CHIEF COMPLAINT:  Follow up results.   HISTORY OF PRESENT ILLNESS:  This is a 50 year old, gravida 2, para 1,  who underwent endometrial sampling for abnormal uterine bleeding, who  returns for results.  __________ pelvic ultrasound on February 26 at  10:45.  Results of this are pending.  Biopsy results are reviewed,  reveal __________ endometrium.  Lab results are reviewed, shows a normal  TSH and a normal FSH.  FSH is actually 4, which means she is not  anywhere near menopause.   PHYSICAL EXAMINATION:  On exam her vitals are as noted in the chart.  She is a well-developed, well-nourished female in no acute distress.  ABDOMEN:  Soft, nontender, nondistended.   IMPRESSION:  Endometrial bleeding, __________ endometrial biopsy, normal  results, normal TSH and FSH.   PLAN:  1. __________ sonogram.  2. Patient given information about hysterectomy, endometrial ablation,      and other treatments depending on the results of her ultrasound.      Will review this with her by phone, and figure out the best      treatment option.           ______________________________  Tinnie Gens, MD     TP/MEDQ  D:  03/13/2006  T:  03/13/2006  Job:  [id number]

## 2010-10-18 LAB — CBC
HCT: 31.5 — ABNORMAL LOW
Hemoglobin: 11 — ABNORMAL LOW
Hemoglobin: 13.9
MCHC: 34.8
MCHC: 35.1
RDW: 13.7
RDW: 14
WBC: 11.3 — ABNORMAL HIGH
WBC: 11.3 — ABNORMAL HIGH
WBC: 9.1

## 2010-10-18 LAB — COMPREHENSIVE METABOLIC PANEL
Alkaline Phosphatase: 57
CO2: 23
Chloride: 107
GFR calc Af Amer: >60
Sodium: 135
Total Bilirubin: 0.4

## 2010-10-18 LAB — BASIC METABOLIC PANEL
CO2: 28
Calcium: 8.6
Chloride: 107
Creatinine, Ser: 0.81
GFR calc Af Amer: >60
GFR calc non Af Amer: >60
Glucose, Bld: 114 — ABNORMAL HIGH
Potassium: 4.1

## 2010-10-18 LAB — HCG, SERUM, QUALITATIVE: Preg, Serum: NEGATIVE

## 2010-11-17 ENCOUNTER — Telehealth: Payer: Self-pay | Admitting: *Deleted

## 2010-11-17 DIAGNOSIS — Z1231 Encounter for screening mammogram for malignant neoplasm of breast: Secondary | ICD-10-CM | POA: Insufficient documentation

## 2010-11-17 NOTE — Telephone Encounter (Signed)
Tell her she needs a f/u and schedule it  Will do order

## 2010-11-17 NOTE — Telephone Encounter (Signed)
Pt wants order for screening mammogram sent to [place].  We have been doing referrals for her but she has not been seen since 01/2007.  Please advise.

## 2010-11-24 ENCOUNTER — Ambulatory Visit: Payer: Self-pay | Admitting: Obstetrics and Gynecology

## 2010-12-01 ENCOUNTER — Encounter: Payer: Self-pay | Admitting: Family Medicine

## 2010-12-02 ENCOUNTER — Encounter: Payer: Self-pay | Admitting: Family Medicine

## 2010-12-02 ENCOUNTER — Ambulatory Visit (INDEPENDENT_AMBULATORY_CARE_PROVIDER_SITE_OTHER): Payer: BC Managed Care – PPO | Admitting: Family Medicine

## 2010-12-02 VITALS — BP 110/74 | HR 88 | Temp 98.4°F | Ht 67.25 in | Wt 212.2 lb

## 2010-12-02 DIAGNOSIS — E1169 Type 2 diabetes mellitus with other specified complication: Secondary | ICD-10-CM | POA: Insufficient documentation

## 2010-12-02 DIAGNOSIS — L919 Hypertrophic disorder of the skin, unspecified: Secondary | ICD-10-CM

## 2010-12-02 DIAGNOSIS — Z23 Encounter for immunization: Secondary | ICD-10-CM

## 2010-12-02 DIAGNOSIS — L909 Atrophic disorder of skin, unspecified: Secondary | ICD-10-CM

## 2010-12-02 DIAGNOSIS — F17200 Nicotine dependence, unspecified, uncomplicated: Secondary | ICD-10-CM

## 2010-12-02 DIAGNOSIS — E785 Hyperlipidemia, unspecified: Secondary | ICD-10-CM

## 2010-12-02 NOTE — Progress Notes (Signed)
Subjective:    Patient ID: [name], female    DOB: 03/25/1960, 50 y.o.   MRN: [id number]  HPI Here to get re established as a patient- last visit in 2009  Has not been to any other general practitioners   Has had partial hyst with 1 ovary removed - gyn - Dr Shawnie Pons , [facility] [facility]  Dr Cloyd Stagers did her surgery  Also had a bladder tack  Last chol check Lab Results  Component Value Date   CHOL 291* 02/25/2007   HDL 28.4* 02/25/2007   LDLDIRECT 148.4 02/25/2007   TRIG 613* 02/25/2007   CHOLHDL 10.2 CALC 02/25/2007    Med changes - none   Wt is down 14 lb form 209  Lost more than that - and gained some back -- wt was 170  Quit smoking for a while - now back to it -- 1ppd  Sometimes less  Started back to help her loose wt again  Does eat very healthy- salads and chicken and fish / no sweet drinks or sweets  Just started sit down office job  Not exercising -- needs to make a plan  Wants to get an elliptical - this weekend   Tab status- back to smoking - thought it would help her loose wt   Has not had cholesterol checked -- tried all drugs and also lipid clinic -- knows its high -- does not want to check it   Mood fluctuates -- coming out of a depression  Lot of stress in life lately  Getting divorced - helped   Is up for Tdap and flu vaccine today   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 FIBROIDS, UTERUS  . TOBACCO ABUSE  . DEPRESSION  . UTI  . PERIMENOPAUSAL STATUS  . SKIN TAG  . RLQ PAIN  . ABFND, RADIOLOGICAL, BREAST NEC  . Other screening mammogram  . Hyperlipidemia   Past Medical History  Diagnosis Date  . Other (abnormal) findings on radiological examination of breast   . Depressive disorder, not elsewhere classified   .  Elevated blood pressure reading without diagnosis of hypertension   . Leiomyoma of uterus, unspecified   . Pure hypercholesterolemia   . Tobacco use disorder    Past Surgical History  Procedure Date  . Vaginal hysterectomy 2008    also had bladder tacked  . Partial hysterectomy     one ovary remains  . Bladder suspension    History  Substance Use Topics  . Smoking status: Current Everyday Smoker -- 1 years    Types: Cigarettes  . Smokeless tobacco: Not on file  . Alcohol Use: Yes     once a month   Family History  Problem Relation Age of Onset  . Cancer Mother     lung cancer  . COPD Father    Allergies  Allergen Reactions  . Midol (Aspirin-Cinnamedrine-Caffeine) Shortness Of Breath  . Acetaminophen     REACTION: dizzy  . Advil   . Bupropion Hcl     REACTION: made worse  . Fenofibrate     REACTION: rash  . Nsaids     REACTION: sob  . Penicillins     REACTION: dizzy  No current outpatient prescriptions on file prior to visit.          Review of Systems Review of Systems  Constitutional: Negative for fever, appetite change, fatigue and pos for wt gain .  Eyes: Negative for pain and visual disturbance.  Respiratory: Negative for cough and shortness of breath.   Cardiovascular: Negative for cp or palpitations    Gastrointestinal: Negative for nausea, diarrhea and constipation.  Genitourinary: Negative for urgency and frequency.  Skin: Negative for pallor or rash   Neurological: Negative for weakness, light-headedness, numbness and headaches.  Hematological: Negative for adenopathy. Does not bruise/bleed easily.  Psychiatric/Behavioral: Negative for dysphoric mood. The patient is not nervous/anxious.          Objective:   Physical Exam  Constitutional: She appears well-developed and well-nourished. No distress.       overwt and well appearing   HENT:  Head: Normocephalic and atraumatic.  Right Ear: External ear normal.  Left Ear: External ear  normal.  Mouth/Throat: Oropharynx is clear and moist.  Eyes: Conjunctivae and EOM are normal. Pupils are equal, round, and reactive to light. No scleral icterus.  Neck: Normal range of motion. Neck supple. No JVD present. No thyromegaly present.  Cardiovascular: Normal rate, regular rhythm, normal heart sounds and intact distal pulses.  Exam reveals no gallop.   Pulmonary/Chest: Effort normal and breath sounds normal. No respiratory distress. She has no wheezes.       Diffusely distant bs   Abdominal: Soft. Bowel sounds are normal. She exhibits no distension. There is no tenderness.  Musculoskeletal: She exhibits no edema and no tenderness.  Lymphadenopathy:    She has no cervical adenopathy.  Neurological: She is alert.  Skin: Skin is warm and dry. No rash noted. No erythema. No pallor.       Skin tags- small and flesh colored noted under bra line and on neck- many on L side  Also scattered SKs - brown and waxy- 3-4 mm under bra line and scattered on torso  Psychiatric: She has a normal mood and affect.          Assessment & Plan:

## 2010-12-02 NOTE — Patient Instructions (Signed)
Call your insurance company about skin tags and SK (seborrheic keratoses)-- if removal is covered and you want to treat them, call us and make an appt  Tdap and flu shots today  Keep thinking about quitting smoking  Start exercise working up to 20-30 minutes 5 days per week -this will help energy level and cholesterol and weight loss

## 2010-12-04 NOTE — Assessment & Plan Note (Signed)
Pt wants these removed but will call to see if ins covers that - then return for proceedure

## 2010-12-04 NOTE — Assessment & Plan Note (Signed)
Disc high chol with pt  Has not tolerated any meds so far - so she is no longer interested in checking it at all  Also not interested in any health mt at this time Disc goals for lipids and reasons to control them Rev labs with pt Rev low sat fat diet in detail

## 2010-12-04 NOTE — Assessment & Plan Note (Signed)
Disc in detail risks of smoking and possible outcomes including copd, vascular/ heart disease, cancer , respiratory and sinus infections  Pt voices understanding  She wants to work on it  Explained she need not replace the cigarettes with food  Given info on the program at the cancer center that is free

## 2010-12-13 ENCOUNTER — Telehealth: Payer: Self-pay | Admitting: *Deleted

## 2010-12-13 NOTE — Telephone Encounter (Signed)
Patient notified as instructed by telephone. 

## 2010-12-13 NOTE — Telephone Encounter (Signed)
I agree- go to UC after hours if needed

## 2010-12-13 NOTE — Telephone Encounter (Signed)
FYI Pt c/o chest cold x 1 weeks, productive cough green mucous request rx called in. I spoke with pt and advised an appt, she is unable to make one due to being the only one at work for the next two days and she can't leave during our office hours. I advised her to continue taking mucinex drink plenty of fluids and if not able to come here to go to an u/c if not improved.

## 2011-01-05 ENCOUNTER — Encounter: Payer: Self-pay | Admitting: Family Medicine

## 2011-01-05 ENCOUNTER — Ambulatory Visit: Payer: Self-pay | Admitting: Family Medicine

## 2011-01-06 ENCOUNTER — Ambulatory Visit: Payer: Self-pay | Admitting: Family Medicine

## 2011-01-09 ENCOUNTER — Encounter: Payer: Self-pay | Admitting: Family Medicine

## 2011-01-10 ENCOUNTER — Telehealth: Payer: Self-pay | Admitting: Internal Medicine

## 2011-01-10 ENCOUNTER — Encounter: Payer: Self-pay | Admitting: *Deleted

## 2011-01-10 NOTE — Telephone Encounter (Signed)
Informed patient of her mammogram results.

## 2011-01-25 ENCOUNTER — Telehealth: Payer: Self-pay | Admitting: Internal Medicine

## 2011-01-25 NOTE — Telephone Encounter (Signed)
She does need f/u with first available -- if symptoms worsen/ or cp - go to ER after hours  I'm thrilled she quit smoking -that is great

## 2011-01-25 NOTE — Telephone Encounter (Signed)
the patient said she had a cold 3 weeks ago and that is when SOB started. Pt said cold symptoms including cough are gone. Pt said she also stopped smoking 3 weeks ago. Pt said upon any activity; walking, cooking etc she gets SOB and feels like something is sitting on her chest. If she is just sitting not having SOB. Pt said no fever,no leg pain or swelling. Pt uses [facility] if pharmacy needed and can be reached at [phone].

## 2011-01-25 NOTE — Telephone Encounter (Signed)
Is she sick? Cough/ fever / leg swelling or pain?

## 2011-01-25 NOTE — Telephone Encounter (Signed)
Patient notified as instructed by telephone. Pt said she could not come in to the office tomorrow so pt is going to urgent care tonight.

## 2011-01-25 NOTE — Telephone Encounter (Signed)
Patient called and said that she is SOB.  No chest pain or numbness but when she is sitting or lying she doesn't have a problem but she she gets up to walk she has to wait to be able to get her breath or she states it's hard to take a deep breath.  Please advise.

## 2011-01-25 NOTE — Telephone Encounter (Signed)
i apologize pt uses [facility] if pharmacy needed not [facility]. Thank you.

## 2011-02-01 ENCOUNTER — Telehealth: Payer: Self-pay | Admitting: Internal Medicine

## 2011-02-01 ENCOUNTER — Encounter: Payer: Self-pay | Admitting: Family Medicine

## 2011-02-01 ENCOUNTER — Ambulatory Visit (INDEPENDENT_AMBULATORY_CARE_PROVIDER_SITE_OTHER): Payer: BC Managed Care – PPO | Admitting: Family Medicine

## 2011-02-01 ENCOUNTER — Emergency Department: Payer: Self-pay

## 2011-02-01 ENCOUNTER — Ambulatory Visit (INDEPENDENT_AMBULATORY_CARE_PROVIDER_SITE_OTHER)
Admission: RE | Admit: 2011-02-01 | Discharge: 2011-02-01 | Disposition: A | Discharge status: Home / Self Care | Payer: BC Managed Care – PPO | Source: Ambulatory Visit | Attending: Family Medicine | Admitting: Family Medicine

## 2011-02-01 VITALS — BP 114/72 | HR 88 | Temp 97.7°F | Ht 67.25 in | Wt 220.0 lb

## 2011-02-01 DIAGNOSIS — IMO0001 Reserved for inherently not codable concepts without codable children: Secondary | ICD-10-CM

## 2011-02-01 DIAGNOSIS — R0602 Shortness of breath: Secondary | ICD-10-CM

## 2011-02-01 DIAGNOSIS — R0789 Other chest pain: Secondary | ICD-10-CM

## 2011-02-01 LAB — BRAIN NATRIURETIC PEPTIDE: Pro B Natriuretic peptide (BNP): 9 pg/mL (ref 0.0–100.0)

## 2011-02-01 LAB — CBC WITH DIFFERENTIAL/PLATELET
Basophils Absolute: 0 10*3/uL (ref 0.0–0.1)
Eosinophils Absolute: 0.1 10*3/uL (ref 0.0–0.7)
HCT: 40.1 % (ref 36.0–46.0)
Lymphocytes Relative: 37.6 % (ref 12.0–46.0)
MCHC: 34.6 g/dL (ref 30.0–36.0)
Monocytes Relative: 8.4 % (ref 3.0–12.0)
Neutro Abs: 4.1 10*3/uL (ref 1.4–7.7)
Neutrophils Relative %: 52.4 % (ref 43.0–77.0)
RDW: 13.1 % (ref 11.5–14.6)

## 2011-02-01 LAB — BASIC METABOLIC PANEL
Calcium: 9.4 mg/dL (ref 8.4–10.5)
Creatinine, Ser: 0.9 mg/dL (ref 0.4–1.2)
Glucose, Bld: 100 mg/dL — ABNORMAL HIGH (ref 70–99)
Sodium: 140 mEq/L (ref 135–145)

## 2011-02-01 LAB — TROPONIN I: Troponin-I: <0.02 ng/mL

## 2011-02-01 NOTE — Telephone Encounter (Signed)
Please let pt know cxr reading nl and all labs nl except d dimer  That can indicate possible blood clot (which could cause shortness of breath )  I want her to go to ER and please send my note / labs and also cxr report and let them know she is coming

## 2011-02-01 NOTE — Telephone Encounter (Signed)
Patient notified as instructed by telephone. Pt said she will go to [facility] ER now. I will call and let ER know and will fax info to [facility].

## 2011-02-01 NOTE — Patient Instructions (Signed)
Labs today- will update you later  Will also get radiology chest xray reading  If symptoms worsen - please go to ER  We will contact you with a plan

## 2011-02-01 NOTE — Progress Notes (Signed)
Subjective:    Patient ID: [name], female    DOB: 02-20-1960, 51 y.o.   MRN: [id number]  HPI Here for sob and wheezing Started getting sick around 27th of December -- was using a new elliptical machine  Is very sob on exertion- but ok with sitting  The sob then turns into wheezing  Gets a little dizzy at times  No nausea  Does not get sweaty   Does have hot flashes from menopause   Had asthma as a kid Does not have an inhaler   Is having pressure in her chest  No pain  L arm tingles occasionally --has pinched nerve in neck  Does not feel sick  No cough or uri symptoms  Did have a severe cold 4 weeks ago   ? Wt is also up 8 lb Just quit smoking last mo-- had problems breathing and that helped a lot   fam hx - GF had MI  Lab Results  Component Value Date   CHOL 291* 02/25/2007   HDL 28.4* 02/25/2007   LDLDIRECT 148.4 02/25/2007   TRIG 613* 02/25/2007   CHOLHDL 10.2 CALC 02/25/2007    On no medicines currently   EKG today shows rate of 83 with sinus rhythm but frequent PACs   Patient Active Problem List  Diagnoses  . FIBROIDS, UTERUS  . TOBACCO ABUSE  . DEPRESSION  . PERIMENOPAUSAL STATUS  . SKIN TAG  . RLQ PAIN  . ABFND, RADIOLOGICAL, BREAST NEC  . Other screening mammogram  . Hyperlipidemia  . Shortness of breath dyspnea  . Chest tightness   Past Medical History  Diagnosis Date  . Other (abnormal) findings on radiological examination of breast   . Depressive disorder, not elsewhere classified   . Elevated blood pressure reading without diagnosis of hypertension   . Leiomyoma of uterus, unspecified   . Pure hypercholesterolemia   . Tobacco use disorder    Past Surgical History  Procedure Date  . Vaginal hysterectomy 2008    also had bladder tacked  . Partial hysterectomy     one ovary remains  . Bladder suspension    History  Substance Use Topics  . Smoking status: Former Smoker -- 1 years    Types: Cigarettes    Quit date: 01/12/2011  .  Smokeless tobacco: Not on file  . Alcohol Use: Yes     once a month   Family History  Problem Relation Age of Onset  . Cancer Mother     lung cancer  . COPD Father    Allergies  Allergen Reactions  . Midol (Aspirin-Cinnamedrine-Caffeine) Shortness Of Breath  . Acetaminophen     REACTION: dizzy  . Advil   . Bupropion Hcl     REACTION: made worse  . Fenofibrate     REACTION: rash  . Nsaids     REACTION: sob  . Penicillins     REACTION: dizzy   No current outpatient prescriptions on file prior to visit.      Review of Systems Review of Systems  Constitutional: Negative for fever, appetite change, pos for fatigue and wt gain  Eyes: Negative for pain and visual disturbance.  Respiratory: Negative for cough  Cardiovascular: Negative for cp or palpitations    Gastrointestinal: Negative for nausea, diarrhea and constipation.  Genitourinary: Negative for urgency and frequency.  Skin: Negative for pallor or rash   Neurological: Negative for weakness, , numbness and headaches. pos for some positional dizziness Hematological: Negative for adenopathy.  Does not bruise/bleed easily.  Psychiatric/Behavioral: Negative for dysphoric mood. The patient is not nervous/anxious.          Objective:   Physical Exam  Constitutional: She appears well-developed and well-nourished. No distress.       overwt and well appearing - but is sob on exertion  HENT:  Head: Normocephalic and atraumatic.  Right Ear: External ear normal.  Left Ear: External ear normal.  Nose: Nose normal.  Mouth/Throat: Oropharynx is clear and moist.  Eyes: Conjunctivae and EOM are normal. Pupils are equal, round, and reactive to light. No scleral icterus.  Neck: Normal range of motion. Neck supple. No JVD present. Carotid bruit is not present. No thyromegaly present.  Cardiovascular: Normal rate, regular rhythm, normal heart sounds and intact distal pulses.  Exam reveals no gallop and no friction rub.   No murmur  heard. Pulmonary/Chest: Effort normal and breath sounds normal. No respiratory distress. She has no wheezes. She has no rales. She exhibits no tenderness.       Sob on exertion and comfortable sitting  Pulse ox 99% after ambulation No rales/ rhonchi Fair air exch - distant bs as expected with smoker  Abdominal: Soft. Bowel sounds are normal. She exhibits no distension, no abdominal bruit and no mass. There is no tenderness.  Musculoskeletal: Normal range of motion. She exhibits tenderness. She exhibits no edema.       Tender diffusely in both calves No palp cords/ redness/ heat or swelling noted   Lymphadenopathy:    She has no cervical adenopathy.  Neurological: She is alert. She has normal reflexes. No cranial nerve deficit. She exhibits normal muscle tone. Coordination normal.  Skin: Skin is warm and dry. No rash noted. No erythema. No pallor.  Psychiatric: She has a normal mood and affect.       Pt says stress in life - no more than usual Seems calm and good eye contact           Assessment & Plan:

## 2011-02-01 NOTE — Telephone Encounter (Signed)
Savanna from [facility] called and reported patient's D-dimer is high at 1.01

## 2011-02-02 ENCOUNTER — Telehealth: Payer: Self-pay | Admitting: Internal Medicine

## 2011-02-02 ENCOUNTER — Telehealth: Payer: Self-pay

## 2011-02-02 DIAGNOSIS — R0789 Other chest pain: Secondary | ICD-10-CM

## 2011-02-02 DIAGNOSIS — IMO0001 Reserved for inherently not codable concepts without codable children: Secondary | ICD-10-CM

## 2011-02-02 NOTE — Assessment & Plan Note (Signed)
EKG and CXR ok  Prev smoker - copd is poss  Also leg tenderness - need to also r/o PE Has risk factors for cardiac dz Lab today incl DD-- if pos will need urgent eval for PE (ordered stat) If neg- needs cardiac consult  Then w/u for copd

## 2011-02-02 NOTE — Telephone Encounter (Signed)
Notes from [facility] have been scanned into pts chart for your review.

## 2011-02-02 NOTE — Telephone Encounter (Signed)
Patient went to ER and she stated they ran all kind of tests and didn't give her an inhaler and they told her they would send you the results and to call this morning.

## 2011-02-02 NOTE — Telephone Encounter (Signed)
Patient was seen in ED last night for chest pain, positivie D Dimer.  CT was negative for PE and troponin was negative, but ER Physician recommening referral to Cardiology for evaluation.

## 2011-02-02 NOTE — Telephone Encounter (Signed)
Great thanks for the update

## 2011-02-02 NOTE — Assessment & Plan Note (Signed)
On exertion - see assessment for chest tenderness Need to r/o cardiac dz/ PE / copd  Also deconditioning - but doubt this is whole explanation

## 2011-02-02 NOTE — Telephone Encounter (Signed)
Shirlee Limerick called me back pt has appt with Dr Mariah Milling on 02/03/11 at 3:15pm arrival time. Pt is aware work in appt. Pt to bring any meds taking with her. If pts condition worsens tonight pt will go to ER. Pt said now just sitting she is not having trouble breathing.

## 2011-02-02 NOTE — Telephone Encounter (Signed)
Patient notified as instructed by telephone. Shirlee Limerick has been in contact with [facility] in [place] and is waiting on call back from that office to verify appt for pt tomorrow. Shirlee Limerick will call pt with definite appt. [facility] address given to pt [address] [place], [place] [address]. Pt will wait to hear from [facility].

## 2011-02-02 NOTE — Telephone Encounter (Signed)
I do not see notes from [facility] so I faxed request to [facility] medical records for 02/01/11 ER notes, labs,xrays,scans,Ekg etc. Pt said the ER dr told her she did not have Pulmonary embolism but told her to call her PCP for further info.Pt said ER dr did not want to give her an inhaler since she was not wheezing and did not want to mask symptoms.Pt said Dr Milinda Antis mentioned at OV yesterday about a cardiology referral. Pt said is she needs to see cardiologist she would like to go to one in [place].Pt wonders if CHF might be a possible diagnosis. (pt has been on computer looking at possible diagnosis for her symptoms)? Pt said she stayed out of work today because sitting upright makes it harder for her to breathe so pt is at home in reclining chair and no bra which makes breathing easier. Pt uses [facility] if pharmacy needed. Please advise.Pt can be reached [phone].

## 2011-02-02 NOTE — Telephone Encounter (Signed)
Was ref to cardiol

## 2011-02-02 NOTE — Telephone Encounter (Signed)
[facility] records have been scanned into pts chart for your review. I will leave paper copy on your shelf in the in box also.

## 2011-02-02 NOTE — Telephone Encounter (Signed)
Next step is referral to cardiology-- I will do that now

## 2011-02-03 ENCOUNTER — Ambulatory Visit (INDEPENDENT_AMBULATORY_CARE_PROVIDER_SITE_OTHER): Payer: BC Managed Care – PPO | Admitting: Cardiovascular Disease

## 2011-02-03 ENCOUNTER — Encounter: Payer: Self-pay | Admitting: Cardiovascular Disease

## 2011-02-03 VITALS — BP 124/76 | HR 78 | Ht 68.0 in | Wt 218.8 lb

## 2011-02-03 DIAGNOSIS — F17200 Nicotine dependence, unspecified, uncomplicated: Secondary | ICD-10-CM

## 2011-02-03 DIAGNOSIS — E785 Hyperlipidemia, unspecified: Secondary | ICD-10-CM

## 2011-02-03 DIAGNOSIS — IMO0001 Reserved for inherently not codable concepts without codable children: Secondary | ICD-10-CM

## 2011-02-03 DIAGNOSIS — R0602 Shortness of breath: Secondary | ICD-10-CM

## 2011-02-03 MED ORDER — FUROSEMIDE 20 MG PO TABS: 20.0000 mg | ORAL_TABLET | Freq: Two times a day (BID) | ORAL | Status: DC | PRN

## 2011-02-03 MED ORDER — IPRATROPIUM-ALBUTEROL 18-103 MCG/ACT IN AERO: 2.0000 | INHALATION_SPRAY | Freq: Four times a day (QID) | RESPIRATORY_TRACT | Status: DC | PRN

## 2011-02-03 NOTE — Assessment & Plan Note (Signed)
She stopped smoking several weeks ago.

## 2011-02-03 NOTE — Assessment & Plan Note (Signed)
Etiology of her shortness of breath is not particularly clear. It waxes and wanes consistent with a pulmonary issue. We have suggested she try a Combivent inhaler p.r.n. For symptoms. She does report feeling bloated with weight gain. BNP was low. Despite this, we have suggested she could try low-dose diuretic for her symptoms as needed. We will contact her within several days to see if she has had improvement of her symptoms. If there has been no improvement, we will order an echocardiogram to rule out underlying LV dysfunction. Less likely would be a viral mediated cardiomyopathy. No signs of heart failure on clinical exam.  Additional testing that could be performed would be a routine stress test. We will discuss this with her if her symptoms persist.

## 2011-02-03 NOTE — Patient Instructions (Addendum)
You are doing well. Please try the inhaler as needed for shortness of breath Ok to try the lasix/furosemide for shortness of breath Take with citrus  Please call us if you have new issues that need to be addressed before your next appt.  Your physician wants you to follow-up in: 1 months.  You will receive a reminder letter in the mail two months in advance. If you don't receive a letter, please call our office to schedule the follow-up appointment.

## 2011-02-03 NOTE — Assessment & Plan Note (Signed)
We have encouraged continued exercise, careful diet management in an effort to lose weight. She may benefit from repeat lipid panel.

## 2011-02-03 NOTE — Progress Notes (Signed)
Patient ID: [name], female    DOB: 06/09/1960, 51 y.o.   MRN: [id number]  HPI Comments: [name] is a pleasant 51 year old woman with a long history of smoking for more than 30 years, starting at age 51, patient of Dr. Milinda Antis, with recent severe upper respiratory infection starting at the end of November, who presents with symptoms of shortness of breath for the past 3 weeks. She had a recent evaluation by Dr. Milinda Antis, blood work showing borderline d-dimer. CT Scan of the chest excluded pulmonary embolism. No other significant findings noted on the CT scan.  She reports having waxing and waning symptoms of shortness of breath. No significant coughing. One month ago, she did have significant cough consistent with possible bronchitis. She did not require antibiotics and her symptoms resolved without intervention. She did have some chest discomfort from coughing during this period.  She reports having significant shortness of breath over the past several weeks, most recently while she was having her CT scan she was gasping for breath. The did not want to give her anything in the emergency room she was told "in case it masked her symptoms".  Currently she reports she feels well today though she did not feel well yesterday in terms of her breathing. She does report having a recent 7 pounds or more weight gain. She reports feeling bloated. Interestingly her BNP was low. Previous EKG by Dr. Milinda Antis shows normal sinus rhythm with rate 81 beats per minute with no significant ST or T wave changes, borderline prolonged QTC  EKG today shows normal sinus rhythm with rate 83 beats per minute with APCs noted, no significant ST or T wave changes   Outpatient Encounter Prescriptions as of 02/03/2011  Medication Sig Dispense Refill  . albuterol-ipratropium (COMBIVENT) 18-103 MCG/ACT inhaler Inhale 2 puffs into the lungs every 6 (six) hours as needed for wheezing.  1 Inhaler  6  . furosemide (LASIX) 20 MG tablet  Take 1 tablet (20 mg total) by mouth 2 (two) times daily as needed.  60 tablet  6     Review of Systems  Constitutional: Negative.   HENT: Negative.   Eyes: Negative.   Respiratory: Positive for shortness of breath.   Cardiovascular: Negative.   Gastrointestinal: Negative.   Musculoskeletal: Negative.   Skin: Negative.   Neurological: Negative.   Hematological: Negative.   Psychiatric/Behavioral: Negative.   All other systems reviewed and are negative.    BP 124/76  Pulse 78  Ht 5\' 8"  (1.727 m)  Wt 218 lb 12.8 oz (99.247 kg)  BMI 33.27 kg/m2  SpO2 98%  Physical Exam  Nursing note and vitals reviewed. Constitutional: She is oriented to person, place, and time. She appears well-developed and well-nourished.  HENT:  Head: Normocephalic.  Nose: Nose normal.  Mouth/Throat: Oropharynx is clear and moist.  Eyes: Conjunctivae are normal. Pupils are equal, round, and reactive to light.  Neck: Normal range of motion. Neck supple. No JVD present.  Cardiovascular: Normal rate, regular rhythm, S1 normal, S2 normal, normal heart sounds and intact distal pulses.  Exam reveals no gallop and no friction rub.   No murmur heard. Pulmonary/Chest: Effort normal and breath sounds normal. No respiratory distress. She has no wheezes. She has no rales. She exhibits no tenderness.  Abdominal: Soft. Bowel sounds are normal. She exhibits no distension. There is no tenderness.  Musculoskeletal: Normal range of motion. She exhibits no edema and no tenderness.  Lymphadenopathy:    She has no cervical  adenopathy.  Neurological: She is alert and oriented to person, place, and time. Coordination normal.  Skin: Skin is warm and dry. No rash noted. No erythema.  Psychiatric: She has a normal mood and affect. Her behavior is normal. Judgment and thought content normal.         Assessment and Plan

## 2011-02-06 ENCOUNTER — Telehealth: Payer: Self-pay

## 2011-02-06 DIAGNOSIS — R0602 Shortness of breath: Secondary | ICD-10-CM

## 2011-02-06 NOTE — Telephone Encounter (Signed)
Pt notified, orders placed for echo, [name] can you call pt to schedule thanks.

## 2011-02-06 NOTE — Telephone Encounter (Signed)
Would order echo to exclude cardiac dysfunction as a cause of SOB

## 2011-02-06 NOTE — Telephone Encounter (Signed)
The patient is not feeling any better with the inhaler or fluid pill that was given at her office visit.  She still has swelling in upper body & shortness of breath is the same.  Please advise what to do?

## 2011-02-13 ENCOUNTER — Telehealth: Payer: Self-pay | Admitting: *Deleted

## 2011-02-13 MED ORDER — BUDESONIDE-FORMOTEROL FUMARATE 80-4.5 MCG/ACT IN AERO: 2.0000 | INHALATION_SPRAY | Freq: Two times a day (BID) | RESPIRATORY_TRACT | Status: DC

## 2011-02-13 NOTE — Telephone Encounter (Signed)
Discussed with Dr. Mariah Milling, wanted to forward to PMD, Dr. Milinda Antis to see if she would want to send in maintenance inhaler, possibly Symbicort? Could do along with Albuterol PRN. I attempted to call pt, LMOM with this information. Will see if inhalers work for her, if so, will hold off on echo for now.

## 2011-02-13 NOTE — Telephone Encounter (Signed)
I am fine with trying symbicort to see if it lasts longer Px written for call in  Please F/u with me in 4-6 weeks

## 2011-02-13 NOTE — Telephone Encounter (Signed)
Patient notified as instructed by telephone. Pt scheduled appt 04/07/11 at 8 am. Pt requested this week and needed a 8am appt.Medication phoned to [facility] pharmacy as instructed.

## 2011-02-13 NOTE — Telephone Encounter (Signed)
Pt was given albuterol and lasix at last ov 02/03/11 for SOB, etiology uncertain at this time. She has long hx of smoking, was seen for SOB lasting 3 weeks after having severe URI end of Nov 2012. Today pt states that the albuterol works, but wears off after 3 1/2 hrs, and then she feels she needs it again. She is also taking lasix 20mg  BID as needed. She is asking what to do in the meantime, can she be given another inhaler for maintenance to see if helps her SOB? I have had her schedule echo per note for a few weeks out in case this is needed, otherwise, do you want a f/u with pulmonary or want to add different inhaler? Please advise.

## 2011-03-06 ENCOUNTER — Ambulatory Visit: Payer: BC Managed Care – PPO | Admitting: Cardiovascular Disease

## 2011-03-07 ENCOUNTER — Other Ambulatory Visit: Payer: BC Managed Care – PPO | Admitting: *Deleted

## 2011-03-10 ENCOUNTER — Ambulatory Visit: Payer: BC Managed Care – PPO | Admitting: Cardiovascular Disease

## 2011-04-07 ENCOUNTER — Encounter: Payer: Self-pay | Admitting: Family Medicine

## 2011-04-07 ENCOUNTER — Ambulatory Visit (INDEPENDENT_AMBULATORY_CARE_PROVIDER_SITE_OTHER): Payer: BC Managed Care – PPO | Admitting: Family Medicine

## 2011-04-07 VITALS — BP 128/78 | HR 84 | Temp 98.1°F | Ht 68.0 in | Wt 223.2 lb

## 2011-04-07 DIAGNOSIS — T887XXA Unspecified adverse effect of drug or medicament, initial encounter: Secondary | ICD-10-CM

## 2011-04-07 DIAGNOSIS — R635 Abnormal weight gain: Secondary | ICD-10-CM

## 2011-04-07 DIAGNOSIS — R0602 Shortness of breath: Secondary | ICD-10-CM

## 2011-04-07 DIAGNOSIS — R600 Localized edema: Secondary | ICD-10-CM | POA: Insufficient documentation

## 2011-04-07 DIAGNOSIS — R609 Edema, unspecified: Secondary | ICD-10-CM

## 2011-04-07 DIAGNOSIS — IMO0001 Reserved for inherently not codable concepts without codable children: Secondary | ICD-10-CM

## 2011-04-07 DIAGNOSIS — T50905A Adverse effect of unspecified drugs, medicaments and biological substances, initial encounter: Secondary | ICD-10-CM | POA: Insufficient documentation

## 2011-04-07 LAB — BASIC METABOLIC PANEL
GFR: 71.44 mL/min (ref >60.00)
Potassium: 4.4 mEq/L (ref 3.5–5.1)
Sodium: 140 mEq/L (ref 135–145)

## 2011-04-07 LAB — TSH: TSH: 1.6 u[IU]/mL (ref 0.35–5.50)

## 2011-04-07 MED ORDER — ALBUTEROL SULFATE HFA 108 (90 BASE) MCG/ACT IN AERS: INHALATION_SPRAY | RESPIRATORY_TRACT | Status: DC

## 2011-04-07 NOTE — Assessment & Plan Note (Signed)
Pt is using symbacort for rescue- not optimal Rev cardiol studies/ notes/ labs sched for spirometry and f/u after  Will use albuterol hfa for rescue and hold symbacort for now

## 2011-04-07 NOTE — Assessment & Plan Note (Signed)
Doing much better with lasix Lab today

## 2011-04-07 NOTE — Patient Instructions (Addendum)
Labs today for potassium and also thyroid  Aim for 30 min of exercise 5 days per week  Start counting calories  Schedule nurse visit for spirometry please Follow up with me about 4-6 weeks after your spirometry  Hold the symbacort for now Use the albuterol inhaler (new ) before exercise

## 2011-04-07 NOTE — Assessment & Plan Note (Addendum)
Pt is frustrated with this Asked to count calories and push exercise to 5 d per week Will re eval at f/u tsh and FT4 today

## 2011-04-07 NOTE — Progress Notes (Signed)
Subjective:    Patient ID: [name], female    DOB: 1960-03-21, 51 y.o.   MRN: [id number]  HPI Here for f/u of dyspnea Since last visit had nl EKG and CXR and CT of chest  Was given combivent and symbacort from cardiol and also diuretic (though BNP was low) Nl 2D echo    Pulse ox is 100% today When she exercises she has to use the inhaler or going up or down steps  It really does help  Is actually using symbacort for rescue (not mt, was confused )   The lasix is really helping with edema  No side effects No leg cramps  Is still a non smoker   Wt is up 5 lb with bmi of 33 Nl bp and pulse  Is really trying to get it off   Weight watchers does not work for her  ? How many calories she takes in  Needs to exercise   Lab Results  Component Value Date   TSH 2.73 02/25/2007    Patient Active Problem List  Diagnoses  . FIBROIDS, UTERUS  . Quit smoking  . DEPRESSION  . PERIMENOPAUSAL STATUS  . SKIN TAG  . RLQ PAIN  . ABFND, RADIOLOGICAL, BREAST NEC  . Other screening mammogram  . Hyperlipidemia  . Shortness of breath dyspnea  . Chest tightness  . Weight gain  . Adverse effects of medication  . Pedal edema   Past Medical History  Diagnosis Date  . Other (abnormal) findings on radiological examination of breast   . Depressive disorder, not elsewhere classified   . Elevated blood pressure reading without diagnosis of hypertension   . Leiomyoma of uterus, unspecified   . Pure hypercholesterolemia   . Tobacco use disorder    Past Surgical History  Procedure Date  . Vaginal hysterectomy 2008    also had bladder tacked  . Partial hysterectomy     one ovary remains  . Bladder suspension    History  Substance Use Topics  . Smoking status: Former Smoker -- 1 years    Types: Cigarettes    Quit date: 01/12/2011  . Smokeless tobacco: Not on file  . Alcohol Use: Yes     once a month   Family History  Problem Relation Age of Onset  . Cancer Mother     lung  cancer  . COPD Father    Allergies  Allergen Reactions  . Midol (Aspirin-Cinnamedrine-Caffeine) Shortness Of Breath  . Acetaminophen     REACTION: dizzy  . Advil   . Bupropion Hcl     REACTION: made worse  . Penicillins     REACTION: dizzy   Current Outpatient Prescriptions on File Prior to Visit  Medication Sig Dispense Refill  . furosemide (LASIX) 20 MG tablet Take 1 tablet (20 mg total) by mouth 2 (two) times daily as needed.  60 tablet  6     Review of Systems Review of Systems  Constitutional: Negative for fever, appetite change, and unexpected weight change. some fatigue  Eyes: Negative for pain and visual disturbance.  Respiratory: Negative for cough and improvement in sob  Cardiovascular: Negative for cp or palpitations    Gastrointestinal: Negative for nausea, diarrhea and constipation.  Genitourinary: Negative for urgency and frequency.  Skin: Negative for pallor or rash   Neurological: Negative for weakness, light-headedness, numbness and headaches.  Hematological: Negative for adenopathy. Does not bruise/bleed easily.  Psychiatric/Behavioral: Negative for dysphoric mood. The patient is not nervous/anxious.  Objective:   Physical Exam  Constitutional: She appears well-developed and well-nourished. No distress.       Obese and well appearing   HENT:  Head: Normocephalic and atraumatic.  Mouth/Throat: Oropharynx is clear and moist.  Eyes: Conjunctivae and EOM are normal. Pupils are equal, round, and reactive to light. No scleral icterus.  Neck: Normal range of motion. Neck supple. No JVD present. Carotid bruit is not present. No thyromegaly present.  Cardiovascular: Normal rate, regular rhythm, normal heart sounds and intact distal pulses.  Exam reveals no gallop.   Pulmonary/Chest: Effort normal and breath sounds normal. No respiratory distress. She has no wheezes. She has no rales. She exhibits no tenderness.       cta with good air exch Pt is not sob  at all   Abdominal: Soft. Bowel sounds are normal. She exhibits no distension, no abdominal bruit and no mass. There is no tenderness.  Musculoskeletal: She exhibits no edema and no tenderness.  Lymphadenopathy:    She has no cervical adenopathy.  Neurological: She is alert. She has normal reflexes. No cranial nerve deficit. She exhibits normal muscle tone. Coordination normal.  Skin: Skin is warm and dry. No rash noted. No erythema. No pallor.  Psychiatric: She has a normal mood and affect.          Assessment & Plan:

## 2011-05-23 ENCOUNTER — Other Ambulatory Visit: Payer: BC Managed Care – PPO

## 2011-06-21 ENCOUNTER — Encounter: Payer: Self-pay | Admitting: Family Medicine

## 2011-06-21 ENCOUNTER — Ambulatory Visit (INDEPENDENT_AMBULATORY_CARE_PROVIDER_SITE_OTHER): Payer: BC Managed Care – PPO | Admitting: Family Medicine

## 2011-06-21 VITALS — BP 120/80 | HR 80 | Temp 98.8°F | Ht 68.0 in | Wt 226.8 lb

## 2011-06-21 DIAGNOSIS — B9689 Other specified bacterial agents as the cause of diseases classified elsewhere: Secondary | ICD-10-CM | POA: Insufficient documentation

## 2011-06-21 DIAGNOSIS — J019 Acute sinusitis, unspecified: Secondary | ICD-10-CM

## 2011-06-21 MED ORDER — ALBUTEROL SULFATE HFA 108 (90 BASE) MCG/ACT IN AERS: INHALATION_SPRAY | RESPIRATORY_TRACT | Status: DC

## 2011-06-21 MED ORDER — LEVOFLOXACIN 500 MG PO TABS: 500.0000 mg | ORAL_TABLET | Freq: Every day | ORAL | Status: AC

## 2011-06-21 MED ORDER — FUROSEMIDE 20 MG PO TABS: 20.0000 mg | ORAL_TABLET | Freq: Two times a day (BID) | ORAL | Status: DC | PRN

## 2011-06-21 NOTE — Progress Notes (Signed)
Subjective:    Patient ID: [name], female    DOB: [birth year], 51 y.o.   MRN: [id number]  HPI Thinks she has a sinus infection- symptoms for 3 weeks and getting worse Purulent nasal drainage  Face and teeth hurt  Started as a chest cold -worked its way up Cough is getting a little better   Fever last week - 102 - missed 3 days of work   Still a non smoker   Ears hurt Throat is dry  Is PCN allergic   otc for her symptoms  Tussin DM and  Also afrin type nasal spray   Patient Active Problem List  Diagnoses  . FIBROIDS, UTERUS  . Quit smoking  . DEPRESSION  . PERIMENOPAUSAL STATUS  . SKIN TAG  . RLQ PAIN  . ABFND, RADIOLOGICAL, BREAST NEC  . Other screening mammogram  . Hyperlipidemia  . Shortness of breath dyspnea  . Chest tightness  . Weight gain  . Adverse effects of medication  . Pedal edema  . Acute bacterial sinusitis   Past Medical History  Diagnosis Date  . Other (abnormal) findings on radiological examination of breast   . Depressive disorder, not elsewhere classified   . Blood pressure elevated without history of HTN   . Leiomyoma of uterus, unspecified   . Pure hypercholesterolemia   . Current smoker    Past Surgical History  Procedure Date  . Vaginal hysterectomy 2008    also had bladder tacked  . Partial hysterectomy     one ovary remains  . Bladder suspension    History  Substance Use Topics  . Smoking status: Former Smoker -- 1 years    Types: Cigarettes    Quit date: 01/12/2011  . Smokeless tobacco: Not on file  . Alcohol Use: Yes     once a month   Family History  Problem Relation Age of Onset  . Cancer Mother     lung cancer  . COPD Father    Allergies  Allergen Reactions  . Midol (Aspirin-Cinnamedrine-Caffeine) Shortness Of Breath  . Acetaminophen     REACTION: dizzy  . Bupropion Hcl     REACTION: made worse  . Ibuprofen   . Penicillins     REACTION: dizzy   Current Outpatient Prescriptions on File Prior to  Visit  Medication Sig Dispense Refill  . albuterol (PROVENTIL HFA;VENTOLIN HFA) 108 (90 BASE) MCG/ACT inhaler 2 puffs before exercise or when short of breath  1 Inhaler  6  . furosemide (LASIX) 20 MG tablet Take 1 tablet (20 mg total) by mouth 2 (two) times daily as needed.  60 tablet  6  . DISCONTD: albuterol-ipratropium (COMBIVENT) 18-103 MCG/ACT inhaler Inhale 2 puffs into the lungs every 6 (six) hours as needed for wheezing.  1 Inhaler  6  . DISCONTD: budesonide-formoterol (SYMBICORT) 80-4.5 MCG/ACT inhaler Inhale 2 puffs into the lungs 2 (two) times daily.  1 Inhaler  11       Review of Systems Review of Systems  Constitutional: Negative for fever, appetite change, and unexpected weight change.  Eyes: Negative for pain and visual disturbance.  ENT pos for congestion/ sinus pain/ tooth pain/st Respiratory: Negative for sob .   Cardiovascular: Negative for cp or palpitations    Gastrointestinal: Negative for nausea, diarrhea and constipation.  Genitourinary: Negative for urgency and frequency.  Skin: Negative for pallor or rash   Neurological: Negative for weakness, light-headedness, numbness and headaches.  Hematological: Negative for adenopathy. Does not bruise/bleed easily.  Psychiatric/Behavioral: Negative for dysphoric mood. The patient is not nervous/anxious.         Objective:   Physical Exam  Constitutional: She appears well-developed and well-nourished. No distress.       Obese and well appearing   HENT:  Head: Normocephalic and atraumatic.  Right Ear: External ear normal.  Left Ear: External ear normal.  Mouth/Throat: Oropharynx is clear and moist. No oropharyngeal exudate.       Nares are injected and congested   bilat maxillary sinus tenderness  Eyes: Conjunctivae are normal. Pupils are equal, round, and reactive to light. Right eye exhibits no discharge. Left eye exhibits no discharge.  Neck: Normal range of motion. Neck supple. No JVD present. No thyromegaly  present.  Cardiovascular: Normal rate and regular rhythm.   Pulmonary/Chest: Effort normal and breath sounds normal. No respiratory distress. She has no wheezes. She has no rales.  Lymphadenopathy:    She has no cervical adenopathy.  Neurological: She is alert.  Skin: Skin is warm and dry. No rash noted.  Psychiatric: She has a normal mood and affect.          Assessment & Plan:

## 2011-06-21 NOTE — Assessment & Plan Note (Signed)
With facial pain/ purulent nasal drainage and fever at home 3 wk after [employer] with levaquin (pcn all)  Disc symptomatic care - see instructions on AVS  Will get rid of vasoconstrictor nasal spray  Update if not starting to improve in a week or if worsening

## 2011-06-21 NOTE — Patient Instructions (Signed)
Use nasal saline spray or netti pot for congestion Stop other over the counter nasal sprays  Warm compresses on face can help  Start the levaquin for sinus infection  Update if not starting to improve in a week or if worsening   Drink lots of water

## 2011-10-25 ENCOUNTER — Encounter: Payer: Self-pay | Admitting: Family Medicine

## 2011-10-25 ENCOUNTER — Ambulatory Visit (INDEPENDENT_AMBULATORY_CARE_PROVIDER_SITE_OTHER): Payer: BC Managed Care – PPO | Admitting: Family Medicine

## 2011-10-25 VITALS — BP 138/84 | HR 80 | Temp 98.2°F | Ht 68.0 in | Wt 226.8 lb

## 2011-10-25 DIAGNOSIS — L089 Local infection of the skin and subcutaneous tissue, unspecified: Secondary | ICD-10-CM

## 2011-10-25 MED ORDER — DOXYCYCLINE HYCLATE 100 MG PO TABS: 100.0000 mg | ORAL_TABLET | Freq: Two times a day (BID) | ORAL | Status: DC

## 2011-10-25 MED ORDER — MUPIROCIN 2 % EX OINT: TOPICAL_OINTMENT | Freq: Two times a day (BID) | CUTANEOUS | Status: DC

## 2011-10-25 NOTE — Patient Instructions (Addendum)
Keep the spot on back of your ear clean and use bactroban ointment twice daily Take the doxycycline as directed  If increased skin rash alert me or if more pain or new symptoms

## 2011-10-25 NOTE — Assessment & Plan Note (Signed)
Papule on back of ear - ? From insect bite or other (unlikely shingles given appearance) Also swollen LN submandibular that side- reactive tx with doxycycline and bactroban ointment Cold compress if helpful Update if not starting to improve in a week or if worsening

## 2011-10-25 NOTE — Progress Notes (Signed)
Subjective:    Patient ID: [name], female    DOB: [birth year], 51 y.o.   MRN: [id number]  HPI Last week - knot behind ear felt like a bug bite  Then LN underneath it got tender Then a funny numb feeling over that side of her face  Ear hurts a little - pops when she swallows  No cold symptoms   No fever   Is tired   Patient Active Problem List  Diagnosis  . FIBROIDS, UTERUS  . Quit smoking  . DEPRESSION  . PERIMENOPAUSAL STATUS  . SKIN TAG  . RLQ PAIN  . ABFND, RADIOLOGICAL, BREAST NEC  . Other screening mammogram  . Hyperlipidemia  . Shortness of breath dyspnea  . Chest tightness  . Weight gain  . Adverse effects of medication  . Pedal edema  . Acute bacterial sinusitis   Past Medical History  Diagnosis Date  . Other (abnormal) findings on radiological examination of breast   . Depressive disorder, not elsewhere classified   . Elevated blood pressure reading without diagnosis of hypertension   . Leiomyoma of uterus, unspecified   . Pure hypercholesterolemia   . Tobacco use disorder    Past Surgical History  Procedure Date  . Vaginal hysterectomy 2008    also had bladder tacked  . Partial hysterectomy     one ovary remains  . Bladder suspension    History  Substance Use Topics  . Smoking status: Former Smoker -- 1 years    Types: Cigarettes    Quit date: 01/12/2011  . Smokeless tobacco: Not on file  . Alcohol Use: Yes     once a month   Family History  Problem Relation Age of Onset  . Cancer Mother     lung cancer  . COPD Father    Allergies  Allergen Reactions  . Midol (Aspirin-Cinnamedrine-Caffeine) Shortness Of Breath  . Acetaminophen     REACTION: dizzy  . Bupropion Hcl     REACTION: made worse  . Ibuprofen   . Penicillins     REACTION: dizzy   Current Outpatient Prescriptions on File Prior to Visit  Medication Sig Dispense Refill  . albuterol (PROVENTIL HFA;VENTOLIN HFA) 108 (90 BASE) MCG/ACT inhaler 2 puffs before exercise or  when short of breath  1 Inhaler  6  . furosemide (LASIX) 20 MG tablet Take 1 tablet (20 mg total) by mouth 2 (two) times daily as needed.  60 tablet  6  . DISCONTD: albuterol-ipratropium (COMBIVENT) 18-103 MCG/ACT inhaler Inhale 2 puffs into the lungs every 6 (six) hours as needed for wheezing.  1 Inhaler  6  . DISCONTD: budesonide-formoterol (SYMBICORT) 80-4.5 MCG/ACT inhaler Inhale 2 puffs into the lungs 2 (two) times daily.  1 Inhaler  11     Review of Systems Review of Systems  Constitutional: Negative for fever, appetite change, fatigue and unexpected weight change.  Eyes: Negative for pain and visual disturbance.  Respiratory: Negative for cough and shortness of breath.   Cardiovascular: Negative for cp or palpitations    Gastrointestinal: Negative for nausea, diarrhea and constipation.  Genitourinary: Negative for urgency and frequency.  Skin: Negative for pallor or rash  pos for red lesion Neurological: Negative for weakness, light-headedness, numbness and headaches.  Hematological: Negative for adenopathy. Does not bruise/bleed easily.  Psychiatric/Behavioral: Negative for dysphoric mood. The patient is not nervous/anxious.         Objective:   Physical Exam  Constitutional: She appears well-developed and well-nourished. No distress.  obese and well appearing   HENT:  Head: Normocephalic and atraumatic.  Eyes: Conjunctivae normal and EOM are normal. Pupils are equal, round, and reactive to light.  Neck: Normal range of motion. Neck supple. No thyromegaly present.       LN enl and tender L submandibular   Cardiovascular: Normal rate, regular rhythm, normal heart sounds and intact distal pulses.  Exam reveals no gallop.   Pulmonary/Chest: Effort normal and breath sounds normal. No respiratory distress. She has no wheezes.  Abdominal: Soft. Bowel sounds are normal.  Musculoskeletal: She exhibits no edema.  Lymphadenopathy:    She has cervical adenopathy.  Neurological:  She is alert. She has normal reflexes.  Skin: Skin is warm and dry. No rash noted. There is erythema. No pallor.       2 papules behind L ear - erythematous and scabbing over No discharge  slt tender No rash  Psychiatric: She has a normal mood and affect.          Assessment & Plan:

## 2012-01-18 ENCOUNTER — Ambulatory Visit: Payer: Self-pay | Admitting: Family Medicine

## 2012-01-19 ENCOUNTER — Encounter: Payer: Self-pay | Admitting: Family Medicine

## 2012-01-22 ENCOUNTER — Encounter: Payer: Self-pay | Admitting: *Deleted

## 2012-07-01 ENCOUNTER — Ambulatory Visit: Payer: BC Managed Care – PPO | Admitting: Family Medicine

## 2012-07-07 IMAGING — CT CT CHEST W/ CM
1 series · 15 of 33 positions shown, 19 images · IV contrast (APPLIED)
Comparison: none

REASON FOR EXAM: SOB with elevated d-dimer from PCP's office; Cr 0.9 from
today's labs at PCPs
COMMENTS:

[Series 4: soft tissue · axial · 0.81mm/px · z∈[-342,-87]mm · 15 of 101 slices shown, 19 images]
[im 8/101  mediastinal]
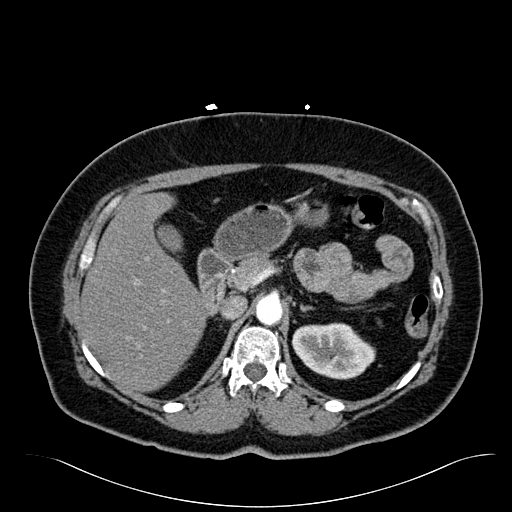
[im 8/101  lung]
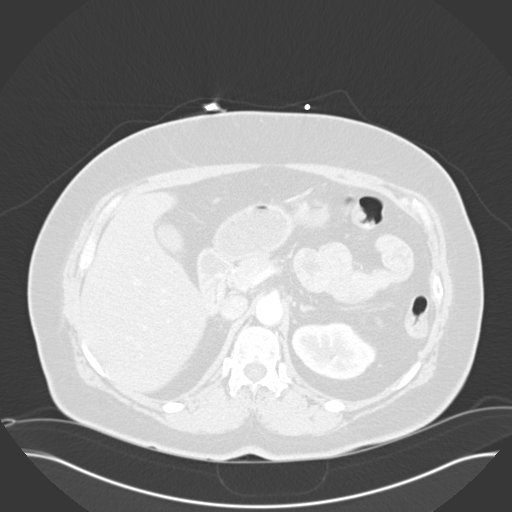
[im 15/101  lung]
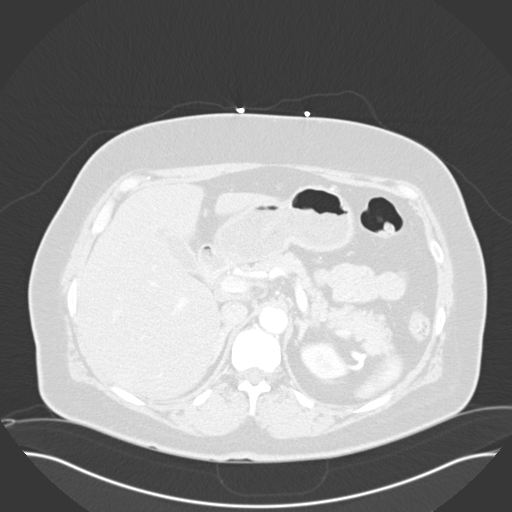
[im 21/101  lung]
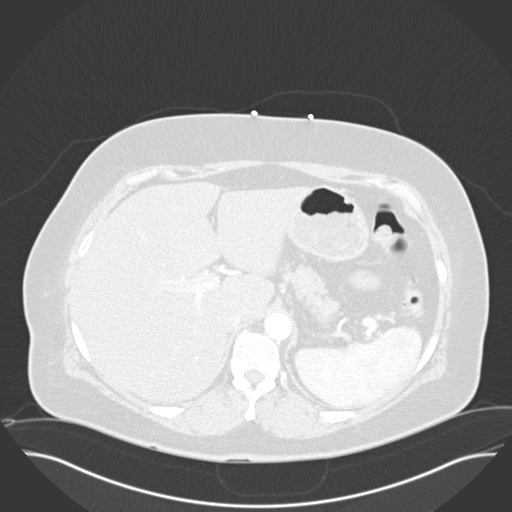
[im 26/101  lung]
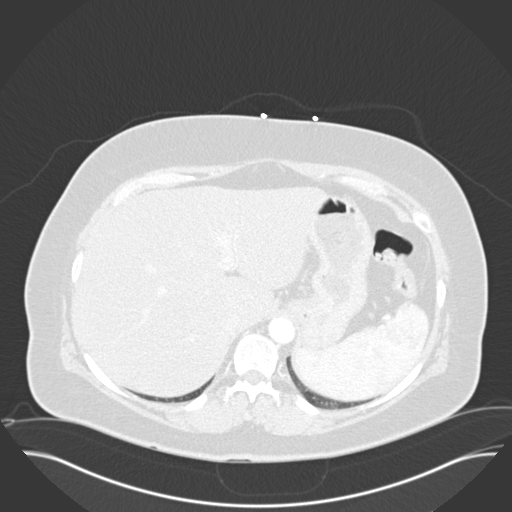
[im 34/101  mediastinal]
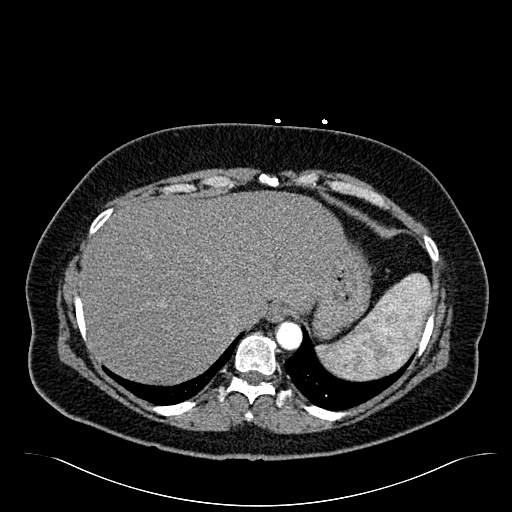
[im 34/101  lung]
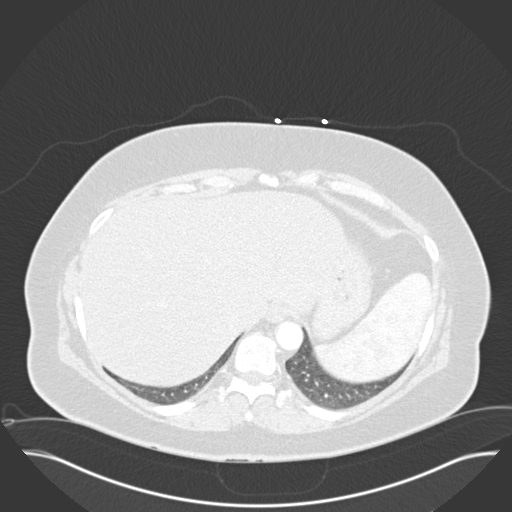
[im 41/101  lung]
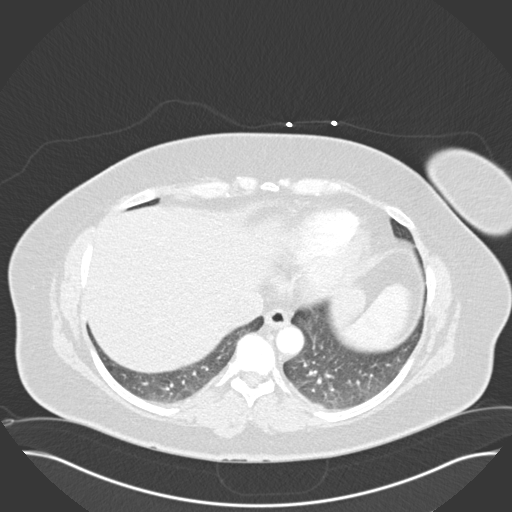
[im 48/101  lung]
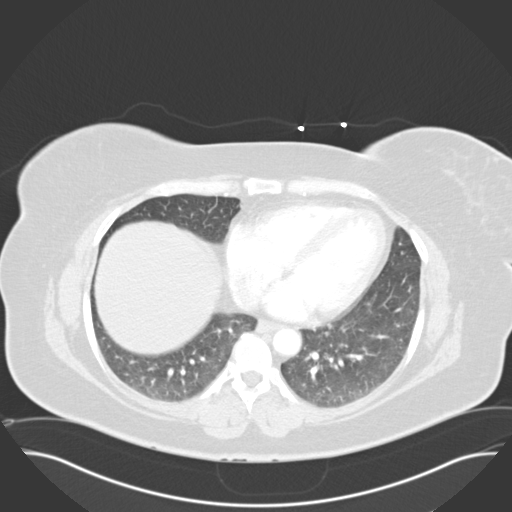
[im 52/101  lung]
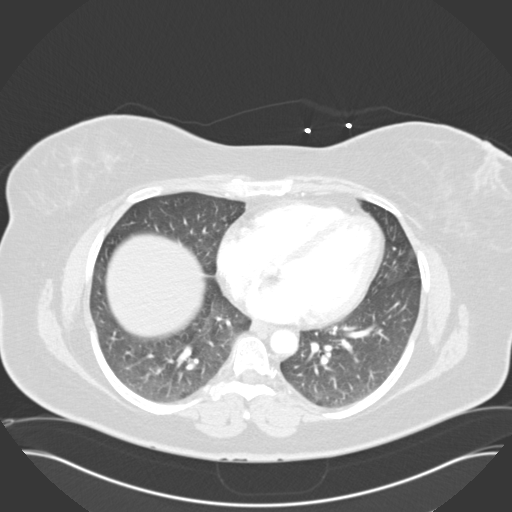
[im 56/101  mediastinal]
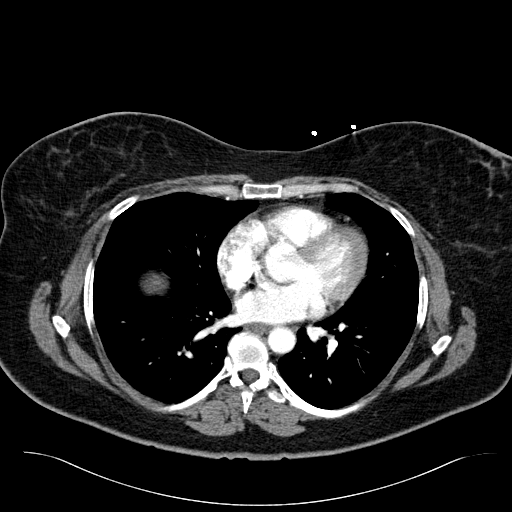
[im 56/101  lung]
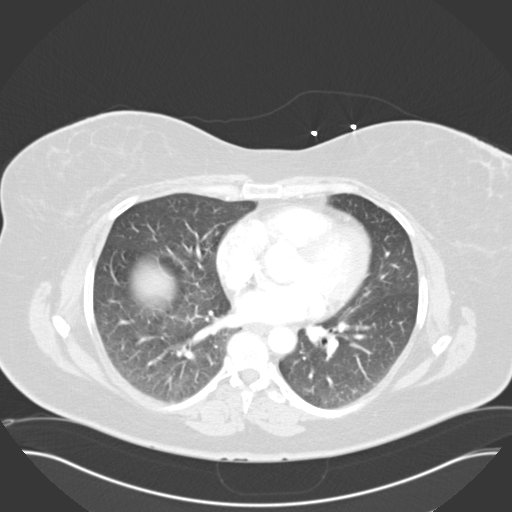
[im 61/101  lung]
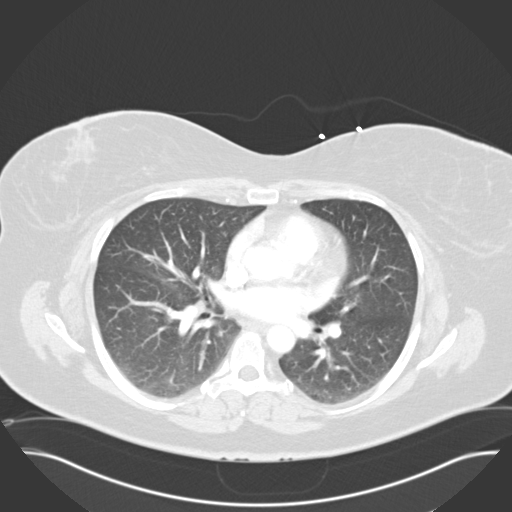
[im 67/101  lung]
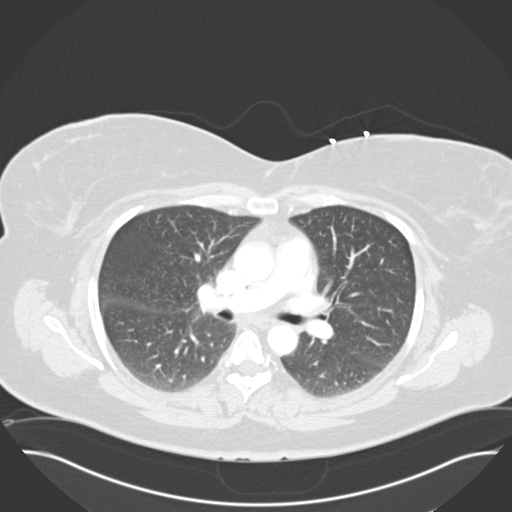
[im 75/101  lung]
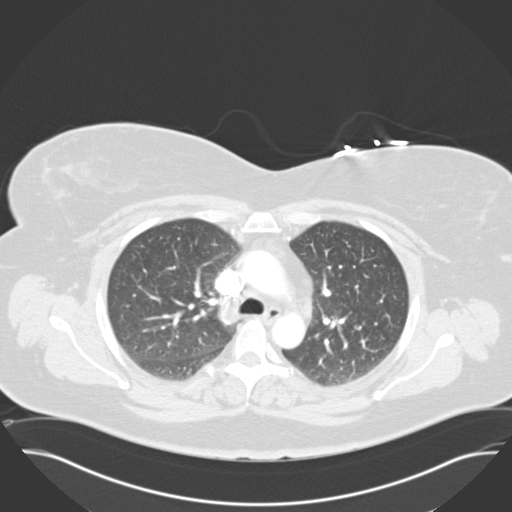
[im 81/101  mediastinal]
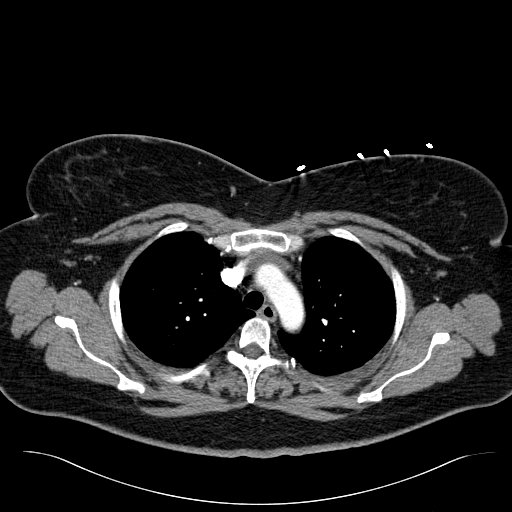
[im 81/101  lung]
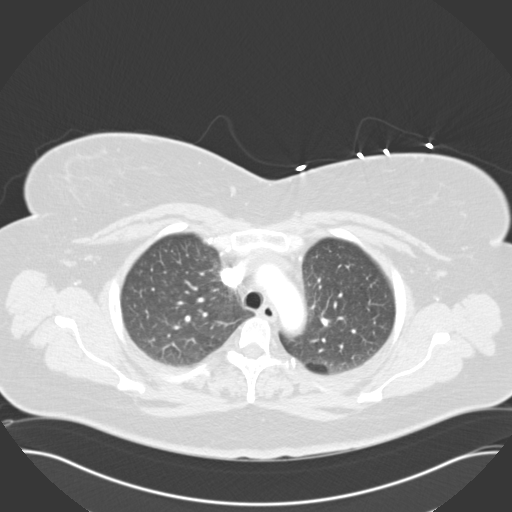
[im 86/101  lung]
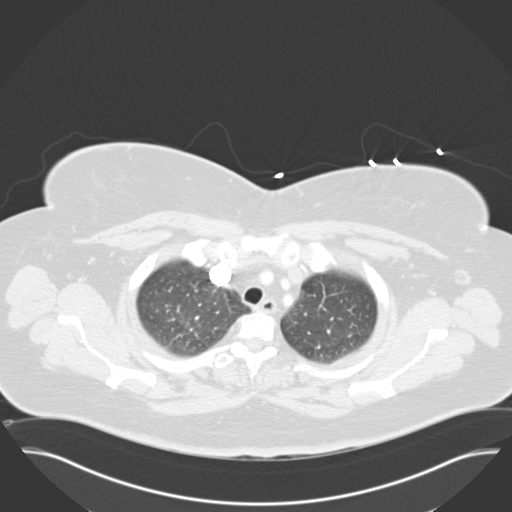
[im 93/101  lung]
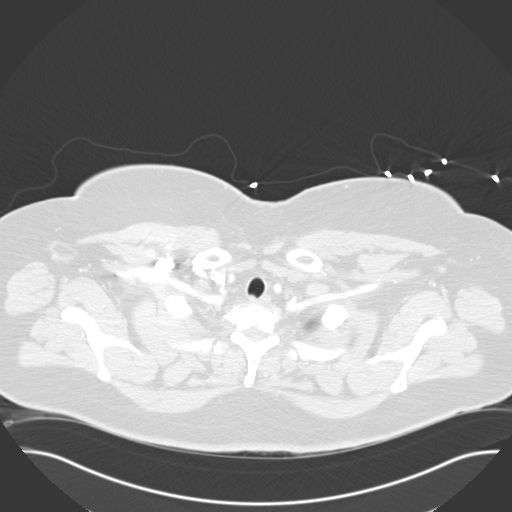

[15 of 33 positions shown; findings below may reference images not displayed]

PROCEDURE:     CT  - CT CHEST (FOR PE) W  - February 01, 2011  [DATE]

RESULT:     Chest CT is performed with 100 mL of Qsovue-MCY iodinated
intravenous contrast. Images are reconstructed at 3.0 mm slice thickness in
the axial plane. The patient has no previous exam for comparison.

Lung window images demonstrate minimal dependent atelectasis. No discrete
pulmonary parenchymal mass or nodule is appreciated. Is no infiltrate,
edema, effusion or pneumothorax. There is a small bullous area along the
posterior aspect of the left upper lobe just superior to the major fissure.
No pleural or pericardial effusion is present. The heart is within normal
limits for size. The thoracic aorta enhances normally without evidence of
aneurysm or dissection. The pulmonary arterial system enhances normally
without filling defect to suggest pulmonary embolism. There is
low-attenuation of the liver consistent with diffuse hepatic steatosis. No
mediastinal or hilar mass or adenopathy is evident.
IMPRESSION: 1. No evidence of pulmonary embolism. No thoracic aortic aneurysm or
thoracic aortic dissection.
2. Hepatic steatosis.
3. Minimal dependent atelectasis present.

## 2012-07-07 IMAGING — CR DG CHEST 1V PORT
1 series · 1 of 1 positions shown · non-contrast
Comparison: none

REASON FOR EXAM: shortness of breath
COMMENTS:

PROCEDURE:     DXR - DXR PORTABLE CHEST SINGLE VIEW  - February 01, 2011  [DATE]
RESULT:     The lungs are clear. The cardiovascular structures are
unremarkable.

[ap]
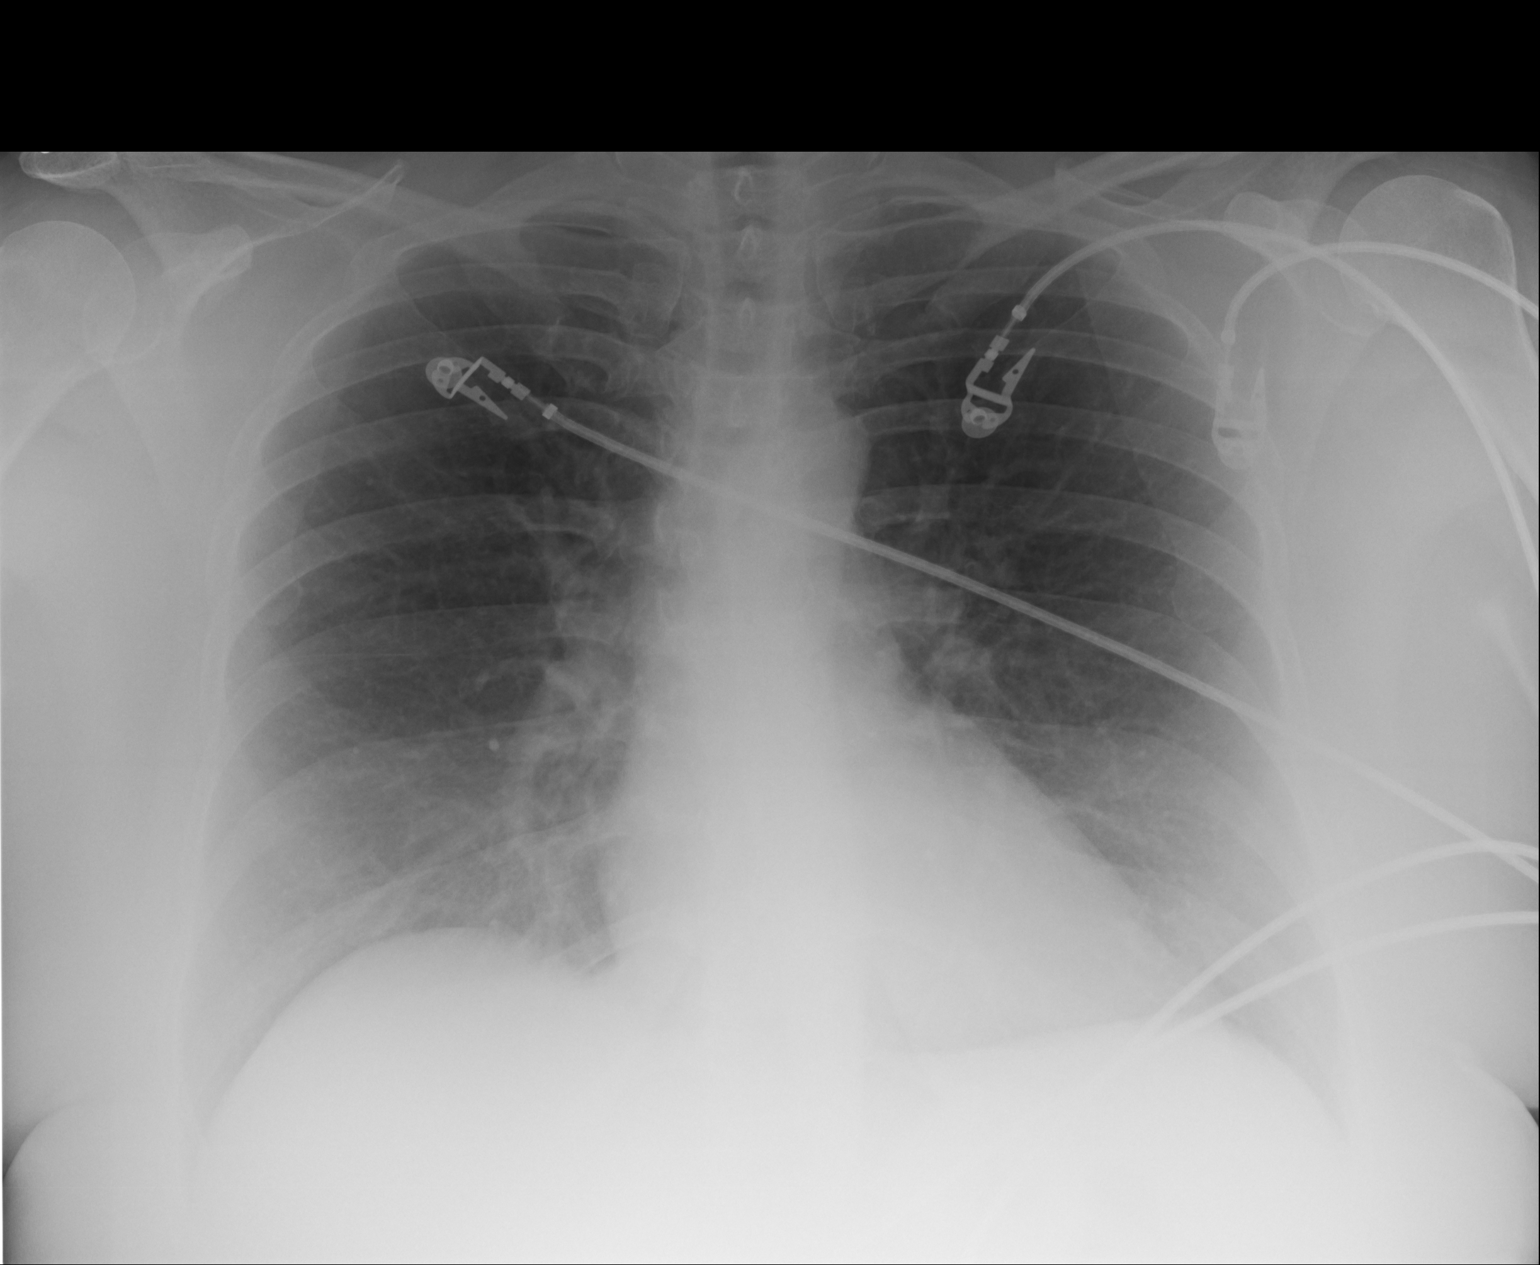

[1 of 1 positions shown; findings below may reference images not displayed]

IMPRESSION: No acute abnormality.

## 2012-07-23 ENCOUNTER — Ambulatory Visit (INDEPENDENT_AMBULATORY_CARE_PROVIDER_SITE_OTHER): Payer: BC Managed Care – PPO | Admitting: Family Medicine

## 2012-07-23 ENCOUNTER — Encounter: Payer: Self-pay | Admitting: Family Medicine

## 2012-07-23 VITALS — BP 106/68 | HR 87 | Temp 98.6°F | Ht 68.0 in | Wt 225.0 lb

## 2012-07-23 DIAGNOSIS — E669 Obesity, unspecified: Secondary | ICD-10-CM | POA: Insufficient documentation

## 2012-07-23 DIAGNOSIS — R5383 Other fatigue: Secondary | ICD-10-CM

## 2012-07-23 DIAGNOSIS — E785 Hyperlipidemia, unspecified: Secondary | ICD-10-CM

## 2012-07-23 DIAGNOSIS — R5381 Other malaise: Secondary | ICD-10-CM

## 2012-07-23 LAB — CBC WITH DIFFERENTIAL/PLATELET
Basophils Relative: 0.3 % (ref 0.0–3.0)
Eosinophils Relative: 1.4 % (ref 0.0–5.0)
HCT: 42.2 % (ref 36.0–46.0)
Hemoglobin: 14.1 g/dL (ref 12.0–15.0)
Lymphs Abs: 2.9 10*3/uL (ref 0.7–4.0)
MCV: 91 fl (ref 78.0–100.0)
Monocytes Absolute: 0.6 10*3/uL (ref 0.1–1.0)
Monocytes Relative: 7.3 % (ref 3.0–12.0)
Neutro Abs: 4.3 10*3/uL (ref 1.4–7.7)
RBC: 4.64 Mil/uL (ref 3.87–5.11)
WBC: 8 10*3/uL (ref 4.5–10.5)

## 2012-07-23 LAB — LIPID PANEL
Cholesterol: 269 mg/dL — ABNORMAL HIGH (ref 0–200)
HDL: 28.2 mg/dL — ABNORMAL LOW (ref >39.00)

## 2012-07-23 LAB — COMPREHENSIVE METABOLIC PANEL
Alkaline Phosphatase: 66 U/L (ref 39–117)
BUN: 18 mg/dL (ref 6–23)
CO2: 27 mEq/L (ref 19–32)
GFR: 72 mL/min (ref >60.00)
Glucose, Bld: 112 mg/dL — ABNORMAL HIGH (ref 70–99)
Sodium: 137 mEq/L (ref 135–145)
Total Bilirubin: 0.5 mg/dL (ref 0.3–1.2)
Total Protein: 7.3 g/dL (ref 6.0–8.3)

## 2012-07-23 LAB — TSH: TSH: 1.74 u[IU]/mL (ref 0.35–5.50)

## 2012-07-23 NOTE — Assessment & Plan Note (Signed)
Lab today Disc problems with wt loss effort  Disc inc exercise Menopausal issues may play a role

## 2012-07-23 NOTE — Assessment & Plan Note (Signed)
Lab today Pt intol of statins-is on fish oil Is still a some day smoker- high cardiovasc risks - counseled on that

## 2012-07-23 NOTE — Patient Instructions (Addendum)
Labs today  For weight loss check out "My fitness pal" app on your phone or weight watchers program  Increase exercise to 30 minutes or more (at least 5 d per week) Take care of yourself Avoid red meat/ fried foods/ egg yolks/ fatty breakfast meats/ butter, cheese and high fat dairy/ and shellfish

## 2012-07-23 NOTE — Progress Notes (Signed)
Subjective:    Patient ID: [name], female    DOB: 1960/11/08, 52 y.o.   MRN: [id number]  HPI Here to discuss problems with loosing weight   Trying to cut calories Breakfast: -snack bars (protein) -80 cal Lunch: tuna in salad - with vinigarette dressing  Dinner - chicken with lettuce - soft tortilla , sometimes some ranch dressing  Some melon - will eat a whole melon in 2 days No sweets or junk food  Does eat out - twice per week - always salads with dressing on the side   Walks for exercise and also landscaping outside  Every day about 20 minutes   She used weight watchers program a long time ago  It did not work well-only lost a few lb and was not exercising   Lab Results  Component Value Date   TSH 1.60 04/07/2011    Very tired from menopause  Is off and on with smoking- fights to quit every day   Taking fish oil now-intol of chol meds  Patient Active Problem List   Diagnosis Date Noted  . Superficial skin infection 10/25/2011  . Weight gain 04/07/2011  . Adverse effects of medication 04/07/2011  . Pedal edema 04/07/2011  . Shortness of breath dyspnea 02/01/2011  . Chest tightness 02/01/2011  . Hyperlipidemia 12/02/2010  . Other screening mammogram 11/17/2010  . FIBROIDS, UTERUS 03/01/2007  . PERIMENOPAUSAL STATUS 02/22/2007  . RLQ PAIN 02/22/2007  . ABFND, RADIOLOGICAL, BREAST NEC 08/21/2006  . SKIN TAG 08/20/2006  . Quit smoking 08/15/2006  . DEPRESSION 06/15/2006   Past Medical History  Diagnosis Date  . Other (abnormal) findings on radiological examination of breast   . Depressive disorder, not elsewhere classified   . Elevated blood pressure reading without diagnosis of hypertension   . Leiomyoma of uterus, unspecified   . Pure hypercholesterolemia   . Tobacco use disorder    Past Surgical History  Procedure Laterality Date  . Vaginal hysterectomy  2008    also had bladder tacked  . Partial hysterectomy      one ovary remains  . Bladder  suspension     History  Substance Use Topics  . Smoking status: Current Some Day Smoker -- 0.25 packs/day    Types: Cigarettes    Last Attempt to Quit: 01/12/2011  . Smokeless tobacco: Not on file  . Alcohol Use: Yes     Comment: once a month   Family History  Problem Relation Age of Onset  . Cancer Mother     lung cancer  . COPD Father    Allergies  Allergen Reactions  . Midol (Aspirin-Cinnamedrine-Caffeine) Shortness Of Breath  . Acetaminophen     REACTION: dizzy  . Bupropion Hcl     REACTION: made worse  . Ibuprofen   . Penicillins     REACTION: dizzy   Current Outpatient Prescriptions on File Prior to Visit  Medication Sig Dispense Refill  . albuterol (PROVENTIL HFA;VENTOLIN HFA) 108 (90 BASE) MCG/ACT inhaler 2 puffs before exercise or when short of breath  1 Inhaler  6  . furosemide (LASIX) 20 MG tablet Take 1 tablet (20 mg total) by mouth 2 (two) times daily as needed.  60 tablet  6  . [DISCONTINUED] albuterol-ipratropium (COMBIVENT) 18-103 MCG/ACT inhaler Inhale 2 puffs into the lungs every 6 (six) hours as needed for wheezing.  1 Inhaler  6  . [DISCONTINUED] budesonide-formoterol (SYMBICORT) 80-4.5 MCG/ACT inhaler Inhale 2 puffs into the lungs 2 (two) times daily.  1 Inhaler  11   No current facility-administered medications on file prior to visit.      Review of Systems    Review of Systems  Constitutional: Negative for fever, appetite change,  and unexpected weight change.  Eyes: Negative for pain and visual disturbance.  Respiratory: Negative for cough and shortness of breath.   Cardiovascular: Negative for cp or palpitations    Gastrointestinal: Negative for nausea, diarrhea and constipation.  Genitourinary: Negative for urgency and frequency.  Skin: Negative for pallor or rash   Neurological: Negative for weakness, light-headedness, numbness and headaches.  Hematological: Negative for adenopathy. Does not bruise/bleed easily.  Psychiatric/Behavioral:  Negative for dysphoric mood. The patient is not nervous/anxious.      Objective:   Physical Exam  Constitutional: She appears well-developed and well-nourished. No distress.  obese and well appearing   HENT:  Head: Normocephalic and atraumatic.  Right Ear: External ear normal.  Left Ear: External ear normal.  Nose: Nose normal.  Mouth/Throat: Oropharynx is clear and moist.  Eyes: Conjunctivae and EOM are normal. Pupils are equal, round, and reactive to light. Right eye exhibits no discharge. Left eye exhibits no discharge. No scleral icterus.  Neck: Normal range of motion. Neck supple. No JVD present. Carotid bruit is not present. No thyromegaly present.  Cardiovascular: Normal rate, regular rhythm and intact distal pulses.   Pulmonary/Chest: Effort normal and breath sounds normal. No respiratory distress. She has no wheezes.  Abdominal: Soft. Bowel sounds are normal. She exhibits no distension, no abdominal bruit and no mass. There is no tenderness.  Musculoskeletal: She exhibits no edema.  Lymphadenopathy:    She has no cervical adenopathy.  Neurological: She is alert. She has normal reflexes. No cranial nerve deficit. She exhibits normal muscle tone. Coordination normal.  Skin: Skin is warm and dry. No rash noted. No erythema. No pallor.  Psychiatric: She has a normal mood and affect.          Assessment & Plan:

## 2012-07-23 NOTE — Assessment & Plan Note (Signed)
Discussed how this problem influences overall health and the risks it imposes  Reviewed plan for weight loss with lower calorie diet (via better food choices and also portion control or program like weight watchers) and exercise building up to or more than 30 minutes 5 days per week including some aerobic activity   Enc her to try an accountability program like my fitness pal or wt watchers

## 2012-07-25 ENCOUNTER — Encounter: Payer: Self-pay | Admitting: *Deleted

## 2012-11-15 ENCOUNTER — Other Ambulatory Visit: Payer: Self-pay

## 2012-11-15 MED ORDER — ALBUTEROL SULFATE HFA 108 (90 BASE) MCG/ACT IN AERS: INHALATION_SPRAY | RESPIRATORY_TRACT | Status: DC

## 2012-11-15 NOTE — Telephone Encounter (Signed)
Pt request refill ventolin inhaler to [facility].Medication(Ventolin HFA inhaler) phoned to [facility] as instructed.pt notified done.

## 2012-11-18 ENCOUNTER — Other Ambulatory Visit: Payer: Self-pay | Admitting: Family Medicine

## 2012-11-18 MED ORDER — FUROSEMIDE 20 MG PO TABS: 20.0000 mg | ORAL_TABLET | Freq: Two times a day (BID) | ORAL | Status: DC | PRN

## 2012-11-18 NOTE — Telephone Encounter (Signed)
Pt requesting refill furosemide to [facility] [place]; advised pt already done and pt will ck with pharmacy.

## 2012-11-28 ENCOUNTER — Other Ambulatory Visit: Payer: Self-pay

## 2013-01-29 ENCOUNTER — Ambulatory Visit: Payer: Self-pay | Admitting: Family Medicine

## 2013-01-31 ENCOUNTER — Encounter: Payer: Self-pay | Admitting: Family Medicine

## 2013-04-01 ENCOUNTER — Telehealth: Payer: Self-pay | Admitting: Family Medicine

## 2013-04-01 DIAGNOSIS — Z1211 Encounter for screening for malignant neoplasm of colon: Secondary | ICD-10-CM

## 2013-04-01 NOTE — Telephone Encounter (Signed)
I will do a ref- please let her know our office will call her within the next week or so

## 2013-04-01 NOTE — Telephone Encounter (Signed)
Please put referral in for Screening Colonoscopy , patient is 20 and hasnt had one yet. Patient wants to go to [facility] in [place].

## 2013-04-01 NOTE — Telephone Encounter (Signed)
Ref for screen colonosc

## 2013-04-02 NOTE — Telephone Encounter (Signed)
Pt notified referral done and Linda/Marion will call to schedule it

## 2013-05-26 ENCOUNTER — Telehealth: Payer: Self-pay | Admitting: Family Medicine

## 2013-05-26 NOTE — Telephone Encounter (Signed)
Route to PCP

## 2013-05-26 NOTE — Telephone Encounter (Signed)
Patient Information:  Caller Name: [place]  Phone: [phone]  Patient: [name], [name]  Gender: Female  DOB: 05/13/1960  Age: 53 Years  PCP: Loura Pardon [facility])  Pregnant: No  Office Follow Up:  Does the office need to follow up with this patient?: No  Instructions For The Office: N/A  RN Note:  05/27/13 at 11:15 scheduled appt for evaluaiton. Care advice and call back parameters reviewed with patient  Symptoms  Reason For Call & Symptoms: Patient is requesting an MRI.  She states injured right knee two weeks ago. [address] , tripped and felt a pop in "back" on her knee.  She reports swelling and pain radiating down to her calf. She is able to ambulate but painful.  No numbness or tingling.  Reviewed Health History In EMR: Yes  Reviewed Medications In EMR: Yes  Reviewed Allergies In EMR: Yes  Reviewed Surgeries / Procedures: Yes  Date of Onset of Symptoms: 05/12/2013  Treatments Tried: Ibuprofen , wrapping area, ice  Treatments Tried Worked: No OB / GYN:  LMP: Unknown  Guideline(s) Used:  Knee Injury  Disposition Per Guideline:   See Today or Tomorrow in Office  Reason For Disposition Reached:   A "snap" or "pop" was heard at the time of injury  Advice Given:  Apply Heat to the Area:  Beginning 48 hours after an injury, apply a warm washcloth or heating pad for 10 minutes three times a day.  This will help increase blood flow and improve healing.  Wrap with an Elastic Bandage:  Wrap the injured part with a snug, elastic bandage for 48 hours.  The pressure from the bandage can make it feel better and help prevent swelling.  If your start to get numbness or tingling of your foot or toes, the bandage may be too tight. Loosen the bandage wrap.  Elevate the Leg:  Lay down and put your leg on a pillow. This puts (elevates) the knee above the heart.  Do this for 15-20 minutes, 2-3 times a day, for the first two days.  This can also help decrease swelling, bruising, and  pain.  Rest vs. Movement:  Movement is generally more healing in the long term than rest.  Avoid running and active sports for 1-2 weeks or until the pain and swelling are gone.  Call Back If:  Pain becomes severe  You become worse.  Patient Will Follow Care Advice:  YES  Appointment Scheduled:  05/27/2013 11:15:00 Appointment Scheduled Provider:  Arnette Norris [facility])

## 2013-05-27 ENCOUNTER — Encounter: Payer: Self-pay | Admitting: Family Medicine

## 2013-05-27 ENCOUNTER — Ambulatory Visit (INDEPENDENT_AMBULATORY_CARE_PROVIDER_SITE_OTHER): Payer: BC Managed Care – PPO | Admitting: Family Medicine

## 2013-05-27 ENCOUNTER — Ambulatory Visit (INDEPENDENT_AMBULATORY_CARE_PROVIDER_SITE_OTHER)
Admission: RE | Admit: 2013-05-27 | Discharge: 2013-05-27 | Disposition: A | Discharge status: Home / Self Care | Payer: BC Managed Care – PPO | Source: Ambulatory Visit | Attending: Family Medicine | Admitting: Family Medicine

## 2013-05-27 VITALS — BP 138/86 | HR 83 | Temp 98.0°F | Ht 68.0 in | Wt 241.5 lb

## 2013-05-27 DIAGNOSIS — M25569 Pain in unspecified knee: Secondary | ICD-10-CM

## 2013-05-27 DIAGNOSIS — M25561 Pain in right knee: Secondary | ICD-10-CM | POA: Insufficient documentation

## 2013-05-27 MED ORDER — TRAMADOL HCL 50 MG PO TABS: 50.0000 mg | ORAL_TABLET | Freq: Three times a day (TID) | ORAL | Status: DC | PRN

## 2013-05-27 NOTE — Progress Notes (Signed)
Subjective:    Patient ID: [name], female    DOB: October 14, 1960, 53 y.o.   MRN: [id number]  HPI Here with R knee injury     (was bothering her to begin with) Twisted it in the yard and she felt a pop in the back  She could bear weight on it - but it was very painful Wrapped it and iced it down  Swelled up quickly- knee and leg  A small area of bruising anteriorly   Yesterday she took ibuprofen- no problems (in the past hx of allergy ) Pain has become worse instead of better  The knee feels unstable  Is limping pretty bad -not using a cane   No known hx of arthritis  Has gained weight   Patient Active Problem List   Diagnosis Date Noted  . Knee pain, right 05/27/2013  . Colon cancer screening 04/01/2013  . Obesity, unspecified 07/23/2012  . Other malaise and fatigue 07/23/2012  . Superficial skin infection 10/25/2011  . Weight gain 04/07/2011  . Adverse effects of medication 04/07/2011  . Pedal edema 04/07/2011  . Shortness of breath dyspnea 02/01/2011  . Chest tightness 02/01/2011  . Hyperlipidemia 12/02/2010  . Other screening mammogram 11/17/2010  . FIBROIDS, UTERUS 03/01/2007  . PERIMENOPAUSAL STATUS 02/22/2007  . RLQ PAIN 02/22/2007  . ABFND, RADIOLOGICAL, BREAST NEC 08/21/2006  . SKIN TAG 08/20/2006  . Quit smoking 08/15/2006  . DEPRESSION 06/15/2006   Past Medical History  Diagnosis Date  . Other (abnormal) findings on radiological examination of breast   . Depressive disorder, not elsewhere classified   . Elevated blood pressure reading without diagnosis of hypertension   . Leiomyoma of uterus, unspecified   . Pure hypercholesterolemia   . Tobacco use disorder    Past Surgical History  Procedure Laterality Date  . Vaginal hysterectomy  2008    also had bladder tacked  . Partial hysterectomy      one ovary remains  . Bladder suspension     History  Substance Use Topics  . Smoking status: Current Every Day Smoker -- 0.50 packs/day    Types:  Cigarettes  . Smokeless tobacco: Not on file  . Alcohol Use: Yes     Comment: once a month   Family History  Problem Relation Age of Onset  . Cancer Mother     lung cancer  . COPD Father    Allergies  Allergen Reactions  . Midol [Aspirin-Cinnamedrine-Caffeine] Shortness Of Breath  . Acetaminophen     REACTION: dizzy  . Bupropion Hcl     REACTION: made worse  . Hydrocodone     n/v  . Ibuprofen   . Penicillins     REACTION: dizzy   Current Outpatient Prescriptions on File Prior to Visit  Medication Sig Dispense Refill  . albuterol (PROVENTIL HFA;VENTOLIN HFA) 108 (90 BASE) MCG/ACT inhaler 2 puffs before exercise or when short of breath  1 Inhaler  6  . furosemide (LASIX) 20 MG tablet Take 1 tablet (20 mg total) by mouth 2 (two) times daily as needed.  60 tablet  5  . [DISCONTINUED] albuterol-ipratropium (COMBIVENT) 18-103 MCG/ACT inhaler Inhale 2 puffs into the lungs every 6 (six) hours as needed for wheezing.  1 Inhaler  6  . [DISCONTINUED] budesonide-formoterol (SYMBICORT) 80-4.5 MCG/ACT inhaler Inhale 2 puffs into the lungs 2 (two) times daily.  1 Inhaler  11   No current facility-administered medications on file prior to visit.    Review of Systems  Review of Systems  Constitutional: Negative for fever, appetite change, fatigue and unexpected weight change.  Eyes: Negative for pain and visual disturbance.  Respiratory: Negative for cough and shortness of breath.   Cardiovascular: Negative for cp or palpitations    Gastrointestinal: Negative for nausea, diarrhea and constipation.  Genitourinary: Negative for urgency and frequency.  Skin: Negative for pallor or rash   MSK pos for knee pain/ swelling / neg for other joint changes  Neurological: Negative for weakness, light-headedness, numbness and headaches.  Hematological: Negative for adenopathy. Does not bruise/bleed easily.  Psychiatric/Behavioral: Negative for dysphoric mood. The patient is not nervous/anxious.       Objective:   Physical Exam  Constitutional: She appears well-developed and well-nourished. No distress.  obese and well appearing   Musculoskeletal:       Right knee: She exhibits decreased range of motion, swelling and effusion. She exhibits no ecchymosis, no deformity, no erythema, normal alignment, no LCL laxity, normal patellar mobility and no MCL laxity. Tenderness found. Medial joint line and patellar tendon tenderness noted.  Can flex R knee to 90 deg  Effusion present No crepitus Pos mc murray No instability Can bear partial weight   Skin: Skin is warm and dry. No rash noted. No erythema.  Psychiatric: She has a normal mood and affect.          Assessment & Plan:

## 2013-05-27 NOTE — Patient Instructions (Signed)
Try tramadol for pain (with food) Elevate leg and use ice when able  Xray now and we will make a plan based on result-we will contact you

## 2013-05-27 NOTE — Progress Notes (Signed)
Pre visit review using our clinic review tool, if applicable. No additional management support is needed unless otherwise documented below in the visit note. 

## 2013-05-28 ENCOUNTER — Telehealth: Payer: Self-pay | Admitting: *Deleted

## 2013-05-28 ENCOUNTER — Telehealth: Payer: Self-pay

## 2013-05-28 DIAGNOSIS — M25461 Effusion, right knee: Secondary | ICD-10-CM | POA: Insufficient documentation

## 2013-05-28 DIAGNOSIS — M25561 Pain in right knee: Secondary | ICD-10-CM

## 2013-05-28 NOTE — Telephone Encounter (Signed)
Pt advise of xray results and she would like to see an ortho doc in [place], I advise pt that Marion/Linda will call her back in the next few days to schedule an appt

## 2013-05-28 NOTE — Assessment & Plan Note (Signed)
After injury with little to no imp over the past week    (also some baseline pain before- ?if poss early OA) Pt has hx of nsaid allergy Will elevate/ice - recommend cane and relative rest  xr today and adv further  Tramadol prn pain  Suspect will ref to ortho

## 2013-05-28 NOTE — Telephone Encounter (Signed)
Pt left v/m requesting cb about test results from 05/27/13.

## 2013-05-28 NOTE — Telephone Encounter (Signed)
Pt was returning call from [place], she has already spoken to [place] and didn't need anything further

## 2013-06-20 ENCOUNTER — Ambulatory Visit: Payer: Self-pay | Admitting: Specialist

## 2014-02-04 ENCOUNTER — Ambulatory Visit: Payer: Self-pay | Admitting: Family Medicine

## 2014-02-05 ENCOUNTER — Encounter: Payer: Self-pay | Admitting: Family Medicine

## 2014-09-18 ENCOUNTER — Ambulatory Visit (INDEPENDENT_AMBULATORY_CARE_PROVIDER_SITE_OTHER): Payer: BLUE CROSS/BLUE SHIELD | Admitting: Obstetrics & Gynecology

## 2014-09-18 ENCOUNTER — Encounter: Payer: Self-pay | Admitting: Obstetrics & Gynecology

## 2014-09-18 VITALS — BP 133/82 | HR 96 | Ht 68.0 in | Wt 238.0 lb

## 2014-09-18 DIAGNOSIS — Z01419 Encounter for gynecological examination (general) (routine) without abnormal findings: Secondary | ICD-10-CM

## 2014-09-18 DIAGNOSIS — Z113 Encounter for screening for infections with a predominantly sexual mode of transmission: Secondary | ICD-10-CM

## 2014-09-18 DIAGNOSIS — Z0000 Encounter for general adult medical examination without abnormal findings: Secondary | ICD-10-CM

## 2014-09-18 LAB — RPR

## 2014-09-18 NOTE — Progress Notes (Signed)
Here today for yearly exam, also has a bartholin cyst that recently ruptured, but is still pea size.  Wants full STI screening.

## 2014-09-18 NOTE — Progress Notes (Signed)
Subjective:    [name] is a 54 y.o. separated  female P50 (32 yo child) who presents for an annual exam. The patient has no complaints today. The patient is not currently sexually active. GYN screening history: last pap: was normal. The patient wears seatbelts: yes. The patient participates in regular exercise: no. Has the patient ever been transfused or tattooed?: yes. (tattoo) The patient reports that there is not domestic violence in her life.   Menstrual History: OB History    No data available      Menarche age: 5  No LMP recorded. Patient has had a hysterectomy.    The following portions of the patient's history were reviewed and updated as appropriate: allergies, current medications, past family history, past medical history, past social history, past surgical history and problem list.  Review of Systems A comprehensive review of systems was negative.    Objective:    BP 133/82 mmHg  Pulse 96  Ht 5\' 8"  (1.727 m)  Wt 238 lb (107.956 kg)  BMI 36.20 kg/m2  General Appearance:    Alert, cooperative, no distress, appears stated age  Head:    Normocephalic, without obvious abnormality, atraumatic  Eyes:    PERRL, conjunctiva/corneas clear, EOM's intact, fundi    benign, both eyes  Ears:    Normal TM's and external ear canals, both ears  Nose:   Nares normal, septum midline, mucosa normal, no drainage    or sinus tenderness  Throat:   Lips, mucosa, and tongue normal; teeth and gums normal  Neck:   Supple, symmetrical, trachea midline, no adenopathy;    thyroid:  no enlargement/tenderness/nodules; no carotid   bruit or JVD  Back:     Symmetric, no curvature, ROM normal, no CVA tenderness  Lungs:     Clear to auscultation bilaterally, respirations unlabored  Chest Wall:    No tenderness or deformity   Heart:    Regular rate and rhythm, S1 and S2 normal, no murmur, rub   or gallop  Breast Exam:    No tenderness, masses, or nipple abnormality  Abdomen:     Soft, non-tender,  bowel sounds active all four quadrants,    no masses, no organomegaly  Genitalia:    Normal female without lesion, discharge or tenderness, no masses on bimanual exam     Extremities:   Extremities normal, atraumatic, no cyanosis or edema  Pulses:   2+ and symmetric all extremities  Skin:   Skin color, texture, turgor normal, no rashes or lesions  Lymph nodes:   Cervical, supraclavicular, and axillary nodes normal  Neurologic:   CNII-XII intact, normal strength, sensation and reflexes    throughout  .    Assessment:    Healthy female exam.    Plan:     Breast self exam technique reviewed and patient encouraged to perform self-exam monthly. STI testing   Schedule removal of vulvar cyst (on right labia minora)

## 2014-09-19 LAB — HEPATITIS C ANTIBODY: HCV AB: NEGATIVE

## 2014-09-19 LAB — GC/CHLAMYDIA PROBE AMP
CT Probe RNA: NEGATIVE
GC PROBE AMP APTIMA: NEGATIVE

## 2014-09-19 LAB — HIV ANTIBODY (ROUTINE TESTING W REFLEX): HIV 1&2 Ab, 4th Generation: NONREACTIVE

## 2014-09-19 LAB — HEPATITIS B SURFACE ANTIGEN: HEP B S AG: NEGATIVE

## 2014-10-02 ENCOUNTER — Ambulatory Visit (INDEPENDENT_AMBULATORY_CARE_PROVIDER_SITE_OTHER): Payer: BLUE CROSS/BLUE SHIELD | Admitting: Obstetrics & Gynecology

## 2014-10-02 ENCOUNTER — Encounter: Payer: Self-pay | Admitting: Obstetrics & Gynecology

## 2014-10-02 VITALS — BP 143/85 | HR 86 | Resp 20 | Wt 243.0 lb

## 2014-10-02 DIAGNOSIS — N907 Vulvar cyst: Secondary | ICD-10-CM | POA: Diagnosis not present

## 2014-10-02 NOTE — Progress Notes (Signed)
   Subjective:    Patient ID: [name], female    DOB: 10/21/1960, 54 y.o.   MRN: [id number]  HPI  This 54 yo lady is here for removal of a right vulvar cyst.  Review of Systems     Objective:   Physical Exam She has a 1 cm right labial cyst (basically a comedeon). I prepped the area with betadine and injected 1 cc of 1% lidocaine. I made a small incision and pushed the contents out. I used silver nitrate to yield hemostasis. She tolerated the procedure well.       Assessment & Plan:  Vulvar cyst- RTC 1 year

## 2014-10-05 ENCOUNTER — Encounter: Payer: Self-pay | Admitting: *Deleted

## 2015-01-27 ENCOUNTER — Telehealth: Payer: Self-pay

## 2015-01-27 MED ORDER — ALBUTEROL SULFATE HFA 108 (90 BASE) MCG/ACT IN AERS: INHALATION_SPRAY | RESPIRATORY_TRACT | Status: DC

## 2015-01-27 MED ORDER — FUROSEMIDE 20 MG PO TABS: 20.0000 mg | ORAL_TABLET | Freq: Two times a day (BID) | ORAL | Status: DC | PRN

## 2015-01-27 NOTE — Telephone Encounter (Signed)
Please set up for a f/u appt and refill until then thanks

## 2015-01-27 NOTE — Telephone Encounter (Signed)
appt scheduled and med refilled 

## 2015-01-27 NOTE — Telephone Encounter (Signed)
Pt left v/m requesting refills on furosemide and ventolin to [facility] Cari Caraway with pt and she needs late appt to be seen by Dr Glori Bickers; no late appts for next 2 weeks and pt is out of med. Does pt need check up or will 15 min med refill appt be OK? Pt last seen acute visit 05/27/2013 and med mgt appt on 07/23/2012. Pt request cb.

## 2015-02-01 ENCOUNTER — Ambulatory Visit: Payer: BLUE CROSS/BLUE SHIELD | Admitting: Family Medicine

## 2015-02-10 ENCOUNTER — Ambulatory Visit (INDEPENDENT_AMBULATORY_CARE_PROVIDER_SITE_OTHER): Payer: BLUE CROSS/BLUE SHIELD | Admitting: Family Medicine

## 2015-02-10 ENCOUNTER — Encounter: Payer: Self-pay | Admitting: Family Medicine

## 2015-02-10 VITALS — BP 126/80 | HR 90 | Temp 98.7°F | Wt 250.0 lb

## 2015-02-10 DIAGNOSIS — R600 Localized edema: Secondary | ICD-10-CM | POA: Diagnosis not present

## 2015-02-10 DIAGNOSIS — E669 Obesity, unspecified: Secondary | ICD-10-CM

## 2015-02-10 DIAGNOSIS — R5382 Chronic fatigue, unspecified: Secondary | ICD-10-CM | POA: Diagnosis not present

## 2015-02-10 DIAGNOSIS — E785 Hyperlipidemia, unspecified: Secondary | ICD-10-CM

## 2015-02-10 DIAGNOSIS — N951 Menopausal and female climacteric states: Secondary | ICD-10-CM | POA: Insufficient documentation

## 2015-02-10 DIAGNOSIS — F17200 Nicotine dependence, unspecified, uncomplicated: Secondary | ICD-10-CM

## 2015-02-10 DIAGNOSIS — Z720 Tobacco use: Secondary | ICD-10-CM

## 2015-02-10 DIAGNOSIS — R5383 Other fatigue: Secondary | ICD-10-CM | POA: Insufficient documentation

## 2015-02-10 MED ORDER — FUROSEMIDE 20 MG PO TABS: 20.0000 mg | ORAL_TABLET | Freq: Two times a day (BID) | ORAL | Status: DC | PRN

## 2015-02-10 MED ORDER — ALBUTEROL SULFATE HFA 108 (90 BASE) MCG/ACT IN AERS: INHALATION_SPRAY | RESPIRATORY_TRACT | Status: DC

## 2015-02-10 NOTE — Patient Instructions (Signed)
Think about quitting smoking Labs today for fatigue/thyroid /hormones  Think about using your bike for exercise - ultimately work towards 5 days per week 30 minutes  Cut back on the milk  Drink more water  Try the "myfitnesspal" app on the phone to assist with weight loss effort Weight watchers is helpful also

## 2015-02-10 NOTE — Progress Notes (Signed)
Pre visit review using our clinic review tool, if applicable. No additional management support is needed unless otherwise documented below in the visit note. 

## 2015-02-10 NOTE — Progress Notes (Signed)
Subjective:    Patient ID: [name], female    DOB: [birth year], 55 y.o.   MRN: [id number]  HPI Here for f/u of chronic health problems   Very tired  Not feeling her best   Not a lot of motivation to take care of herself  May be mood related  ? If menopause -has one ovary left  Is hot all the time - hot natured by not hot flashes   Wt is up 7 lb with bmi of 38   Continues to gain weight   Wonders about thyroid  Hair loss  Wt gain Fatigue   Smoking - started back 3 cig per day after she eats  Not ready to quit   She eats very healthy (avoids greasy foods)  Drinks a lot of 2% milk - just realized it has a lot of calories  Quit soda 6 mo ago   Still taking lasix for pedal edema    Patient Active Problem List   Diagnosis Date Noted  . Effusion of knee joint right 05/28/2013  . Knee pain, right 05/27/2013  . Colon cancer screening 04/01/2013  . Obesity, unspecified 07/23/2012  . Other malaise and fatigue 07/23/2012  . Superficial skin infection 10/25/2011  . Weight gain 04/07/2011  . Adverse effects of medication 04/07/2011  . Pedal edema 04/07/2011  . Shortness of breath dyspnea 02/01/2011  . Chest tightness 02/01/2011  . Hyperlipidemia 12/02/2010  . Other screening mammogram 11/17/2010  . FIBROIDS, UTERUS 03/01/2007  . PERIMENOPAUSAL STATUS 02/22/2007  . RLQ PAIN 02/22/2007  . ABFND, RADIOLOGICAL, BREAST NEC 08/21/2006  . SKIN TAG 08/20/2006  . Quit smoking 08/15/2006  . DEPRESSION 06/15/2006   Past Medical History  Diagnosis Date  . Other (abnormal) findings on radiological examination of breast   . Depressive disorder, not elsewhere classified   . Elevated blood pressure reading without diagnosis of hypertension   . Leiomyoma of uterus, unspecified   . Pure hypercholesterolemia   . Tobacco use disorder    Past Surgical History  Procedure Laterality Date  . Vaginal hysterectomy  2008    also had bladder tacked  . Partial hysterectomy      one  ovary remains  . Bladder suspension    . Bunionectomy     Social History  Substance Use Topics  . Smoking status: Current Every Day Smoker -- 0.25 packs/day    Types: Cigarettes  . Smokeless tobacco: None  . Alcohol Use: No     Comment: once a month   Family History  Problem Relation Age of Onset  . Cancer Mother     lung cancer  . COPD Father    Allergies  Allergen Reactions  . Midol [Aspirin-Cinnamedrine-Caffeine] Shortness Of Breath  . Acetaminophen     REACTION: dizzy  . Bupropion Hcl     REACTION: made worse  . Hydrocodone     n/v  . Ibuprofen   . Penicillins     REACTION: dizzy   Current Outpatient Prescriptions on File Prior to Visit  Medication Sig Dispense Refill  . albuterol (PROVENTIL HFA;VENTOLIN HFA) 108 (90 Base) MCG/ACT inhaler 2 puffs before exercise or when short of breath 1 Inhaler 0  . furosemide (LASIX) 20 MG tablet Take 1 tablet (20 mg total) by mouth 2 (two) times daily as needed. 60 tablet 0  . [DISCONTINUED] albuterol-ipratropium (COMBIVENT) 18-103 MCG/ACT inhaler Inhale 2 puffs into the lungs every 6 (six) hours as needed for wheezing. 1 Inhaler 6  . [  DISCONTINUED] budesonide-formoterol (SYMBICORT) 80-4.5 MCG/ACT inhaler Inhale 2 puffs into the lungs 2 (two) times daily. 1 Inhaler 11   No current facility-administered medications on file prior to visit.      Review of Systems    Review of Systems  Constitutional: Negative for fever, appetite change,  And pos for  unexpected weight change.  Eyes: Negative for pain and visual disturbance.  Respiratory: Negative for cough and shortness of breath.   Cardiovascular: Negative for cp or palpitations    Gastrointestinal: Negative for nausea, diarrhea and constipation.  Genitourinary: Negative for urgency and frequency. neg for vaginal symptoms  Skin: Negative for pallor or rash   MSK pos for aches and pains  Neurological: Negative for weakness, light-headedness, numbness and headaches.    Hematological: Negative for adenopathy. Does not bruise/bleed easily.  Psychiatric/Behavioral: pos  for dysphoric mood. The patient is not nervous/anxious.      Objective:   Physical Exam  Constitutional: She appears well-developed and well-nourished. No distress.  obese and well appearing   HENT:  Head: Normocephalic and atraumatic.  Mouth/Throat: Oropharynx is clear and moist.  Eyes: Conjunctivae and EOM are normal. Pupils are equal, round, and reactive to light.  Neck: Normal range of motion. Neck supple. No JVD present. Carotid bruit is not present. No thyromegaly present.  Cardiovascular: Normal rate, regular rhythm, normal heart sounds and intact distal pulses.  Exam reveals no gallop.   Pulmonary/Chest: Effort normal and breath sounds normal. No respiratory distress. She has no wheezes. She has no rales.  No crackles  Diffusely distant bs  Good air exch  Abdominal: Soft. Bowel sounds are normal. She exhibits no distension, no abdominal bruit and no mass. There is no tenderness.  Musculoskeletal: She exhibits no edema or tenderness.  Lymphadenopathy:    She has no cervical adenopathy.  Neurological: She is alert. She has normal reflexes. No cranial nerve deficit. She exhibits normal muscle tone. Coordination normal.  Skin: Skin is warm and dry. No rash noted. No erythema. No pallor.  Psychiatric: Her speech is normal and behavior is normal. Thought content normal. Her affect is not blunt and not labile. She exhibits a depressed mood.  Somewhat depressed affect  Eager to discuss her problems and stressors           Assessment & Plan:   Problem List Items Addressed This Visit      Other   Fatigue    Suspect multifactorial Frustrated/depressed with wt gain  Disc role of hormones Lab today incl tsh and cbc       Relevant Orders   CBC with Differential/Platelet   Comprehensive metabolic panel   TSH   Vitamin B12   T4, Free   Hyperlipidemia    Lab today  Diet  is fair  Rev low sat fat guidelines       Relevant Medications   furosemide (LASIX) 20 MG tablet   Other Relevant Orders   Lipid panel   Menopausal symptoms    Heat intol/fatigue and wt gain in pt with hx of hysterectomy FSH and LH today to see if she is menopausal      Relevant Orders   Follicle Stimulating Hormone   Luteinizing hormone   Obesity, unspecified    Discussed how this problem influences overall health and the risks it imposes  Reviewed plan for weight loss with lower calorie diet (via better food choices and also portion control or program like weight watchers) and exercise building up to or more  than 30 minutes 5 days per week including some aerobic activity   Suggested starting to use the exercise bike       Pedal edema - Primary    Lasix works well for this bp is stable Lab today Med refilled for a year       Relevant Orders   Comprehensive metabolic panel   TSH

## 2015-02-11 LAB — CBC WITH DIFFERENTIAL/PLATELET
Basophils Absolute: 0 10*3/uL (ref 0.0–0.1)
Basophils Relative: 0.4 % (ref 0.0–3.0)
Eosinophils Absolute: 0.2 10*3/uL (ref 0.0–0.7)
Eosinophils Relative: 1.5 % (ref 0.0–5.0)
HCT: 41.9 % (ref 36.0–46.0)
Hemoglobin: 14.3 g/dL (ref 12.0–15.0)
Lymphocytes Relative: 39.2 % (ref 12.0–46.0)
Lymphs Abs: 4.9 10*3/uL — ABNORMAL HIGH (ref 0.7–4.0)
MCHC: 34.1 g/dL (ref 30.0–36.0)
MCV: 88.9 fl (ref 78.0–100.0)
Monocytes Absolute: 0.7 10*3/uL (ref 0.1–1.0)
Monocytes Relative: 5.8 % (ref 3.0–12.0)
Neutro Abs: 6.6 10*3/uL (ref 1.4–7.7)
Neutrophils Relative %: 53.1 % (ref 43.0–77.0)
Platelets: 327 10*3/uL (ref 150.0–400.0)
RBC: 4.72 Mil/uL (ref 3.87–5.11)
RDW: 13.4 % (ref 11.5–15.5)
WBC: 12.4 10*3/uL — ABNORMAL HIGH (ref 4.0–10.5)

## 2015-02-11 LAB — COMPREHENSIVE METABOLIC PANEL
ALK PHOS: 77 U/L (ref 39–117)
ALT: 48 U/L — AB (ref 0–35)
AST: 26 U/L (ref 0–37)
Albumin: 4.6 g/dL (ref 3.5–5.2)
BILIRUBIN TOTAL: 0.3 mg/dL (ref 0.2–1.2)
BUN: 20 mg/dL (ref 6–23)
CO2: 26 mEq/L (ref 19–32)
Calcium: 9.8 mg/dL (ref 8.4–10.5)
Chloride: 101 mEq/L (ref 96–112)
Creatinine, Ser: 0.9 mg/dL (ref 0.40–1.20)
GFR: 69.46 mL/min (ref >60.00)
Glucose, Bld: 94 mg/dL (ref 70–99)
Potassium: 4.6 mEq/L (ref 3.5–5.1)
SODIUM: 136 meq/L (ref 135–145)
TOTAL PROTEIN: 7.5 g/dL (ref 6.0–8.3)

## 2015-02-11 LAB — FOLLICLE STIMULATING HORMONE: FSH: 52.3 m[IU]/mL

## 2015-02-11 LAB — VITAMIN B12: Vitamin B-12: 339 pg/mL (ref 211–911)

## 2015-02-11 LAB — T4, FREE: Free T4: 0.71 ng/dL (ref 0.60–1.60)

## 2015-02-11 LAB — LIPID PANEL
CHOL/HDL RATIO: 9
Cholesterol: 288 mg/dL — ABNORMAL HIGH (ref 0–200)
HDL: 30.8 mg/dL — AB (ref >39.00)

## 2015-02-11 LAB — LUTEINIZING HORMONE: LH: 17.81 m[IU]/mL

## 2015-02-11 LAB — TSH: TSH: 2.96 u[IU]/mL (ref 0.35–4.50)

## 2015-02-11 LAB — LDL CHOLESTEROL, DIRECT: Direct LDL: 117 mg/dL

## 2015-02-11 NOTE — Assessment & Plan Note (Signed)
Discussed how this problem influences overall health and the risks it imposes  Reviewed plan for weight loss with lower calorie diet (via better food choices and also portion control or program like weight watchers) and exercise building up to or more than 30 minutes 5 days per week including some aerobic activity   Suggested starting to use the exercise bike

## 2015-02-11 NOTE — Assessment & Plan Note (Signed)
Lasix works well for this bp is stable Lab today Med refilled for a year

## 2015-02-11 NOTE — Assessment & Plan Note (Signed)
Heat intol/fatigue and wt gain in pt with hx of hysterectomy FSH and LH today to see if she is menopausal

## 2015-02-11 NOTE — Assessment & Plan Note (Signed)
Disc in detail risks of smoking and possible outcomes including copd, vascular/ heart disease, cancer , respiratory and sinus infections  Pt voices understanding She is not ready to quit  

## 2015-02-11 NOTE — Assessment & Plan Note (Signed)
Suspect multifactorial Frustrated/depressed with wt gain  Disc role of hormones Lab today incl tsh and cbc

## 2015-02-11 NOTE — Assessment & Plan Note (Signed)
Lab today  Diet is fair  Rev low sat fat guidelines

## 2015-02-12 ENCOUNTER — Encounter: Payer: Self-pay | Admitting: Family Medicine

## 2015-02-15 ENCOUNTER — Encounter: Payer: Self-pay | Admitting: Family Medicine

## 2015-02-15 ENCOUNTER — Telehealth: Payer: Self-pay | Admitting: Family Medicine

## 2015-02-15 MED ORDER — GEMFIBROZIL 600 MG PO TABS: 600.0000 mg | ORAL_TABLET | Freq: Two times a day (BID) | ORAL | Status: DC

## 2015-02-15 NOTE — Telephone Encounter (Signed)
Gemfibrozil sent

## 2015-02-16 ENCOUNTER — Other Ambulatory Visit: Payer: Self-pay | Admitting: Family Medicine

## 2015-02-16 DIAGNOSIS — Z1231 Encounter for screening mammogram for malignant neoplasm of breast: Secondary | ICD-10-CM

## 2015-02-24 ENCOUNTER — Other Ambulatory Visit: Payer: Self-pay | Admitting: Family Medicine

## 2015-02-24 NOTE — Telephone Encounter (Signed)
Rx sent to new pharmacy.

## 2015-02-26 ENCOUNTER — Ambulatory Visit
Admission: RE | Admit: 2015-02-26 | Discharge: 2015-02-26 | Disposition: A | Discharge status: Home / Self Care | Payer: BLUE CROSS/BLUE SHIELD | Source: Ambulatory Visit | Attending: Family Medicine | Admitting: Family Medicine

## 2015-02-26 DIAGNOSIS — Z1231 Encounter for screening mammogram for malignant neoplasm of breast: Secondary | ICD-10-CM

## 2015-04-29 ENCOUNTER — Telehealth: Payer: Self-pay | Admitting: Family Medicine

## 2015-04-29 DIAGNOSIS — D72829 Elevated white blood cell count, unspecified: Secondary | ICD-10-CM | POA: Insufficient documentation

## 2015-04-29 DIAGNOSIS — E785 Hyperlipidemia, unspecified: Secondary | ICD-10-CM

## 2015-04-29 NOTE — Telephone Encounter (Signed)
-----   Message from Marchia Bond sent at 04/29/2015  2:20 PM EDT ----- Regarding: fasting labs tomorrow 4/7, need orders. Thanks! :-) Pt is scheduled for labs tomorrow, need future orders.  Thanks Aniceto Boss

## 2015-04-30 ENCOUNTER — Other Ambulatory Visit (INDEPENDENT_AMBULATORY_CARE_PROVIDER_SITE_OTHER): Payer: BLUE CROSS/BLUE SHIELD

## 2015-04-30 DIAGNOSIS — D72829 Elevated white blood cell count, unspecified: Secondary | ICD-10-CM | POA: Diagnosis not present

## 2015-04-30 DIAGNOSIS — E785 Hyperlipidemia, unspecified: Secondary | ICD-10-CM

## 2015-04-30 LAB — CBC WITH DIFFERENTIAL/PLATELET
BASOS ABS: 0 10*3/uL (ref 0.0–0.1)
Basophils Relative: 0.5 % (ref 0.0–3.0)
Eosinophils Absolute: 0.2 10*3/uL (ref 0.0–0.7)
Eosinophils Relative: 1.6 % (ref 0.0–5.0)
HCT: 42.9 % (ref 36.0–46.0)
Hemoglobin: 14.8 g/dL (ref 12.0–15.0)
LYMPHS ABS: 4.1 10*3/uL — AB (ref 0.7–4.0)
LYMPHS PCT: 38.9 % (ref 12.0–46.0)
MCHC: 34.4 g/dL (ref 30.0–36.0)
MCV: 89.5 fl (ref 78.0–100.0)
MONO ABS: 0.9 10*3/uL (ref 0.1–1.0)
Monocytes Relative: 8.5 % (ref 3.0–12.0)
NEUTROS PCT: 50.5 % (ref 43.0–77.0)
Neutro Abs: 5.3 10*3/uL (ref 1.4–7.7)
Platelets: 354 10*3/uL (ref 150.0–400.0)
RBC: 4.8 Mil/uL (ref 3.87–5.11)
RDW: 13.5 % (ref 11.5–15.5)
WBC: 10.4 10*3/uL (ref 4.0–10.5)

## 2015-04-30 LAB — LIPID PANEL
CHOL/HDL RATIO: 9
CHOLESTEROL: 273 mg/dL — AB (ref 0–200)
HDL: 30.9 mg/dL — AB (ref >39.00)

## 2015-04-30 LAB — AST: AST: 18 U/L (ref 0–37)

## 2015-04-30 LAB — ALT: ALT: 43 U/L — AB (ref 0–35)

## 2015-04-30 LAB — LDL CHOLESTEROL, DIRECT: LDL DIRECT: 147 mg/dL

## 2015-09-16 DIAGNOSIS — D485 Neoplasm of uncertain behavior of skin: Secondary | ICD-10-CM | POA: Diagnosis not present

## 2015-09-16 DIAGNOSIS — B079 Viral wart, unspecified: Secondary | ICD-10-CM | POA: Diagnosis not present

## 2015-09-16 DIAGNOSIS — L821 Other seborrheic keratosis: Secondary | ICD-10-CM | POA: Diagnosis not present

## 2015-09-20 DIAGNOSIS — B078 Other viral warts: Secondary | ICD-10-CM | POA: Diagnosis not present

## 2016-02-01 ENCOUNTER — Other Ambulatory Visit: Payer: Self-pay | Admitting: Family Medicine

## 2016-02-01 DIAGNOSIS — Z1231 Encounter for screening mammogram for malignant neoplasm of breast: Secondary | ICD-10-CM

## 2016-02-16 ENCOUNTER — Other Ambulatory Visit: Payer: Self-pay | Admitting: Family Medicine

## 2016-02-16 NOTE — Telephone Encounter (Signed)
Pt hasn't been seen in over a year and no future appts., please advise  

## 2016-02-16 NOTE — Telephone Encounter (Signed)
Please schedule a spring f/u and refill until then 

## 2016-02-17 NOTE — Telephone Encounter (Signed)
appt scheduled and med refilled 

## 2016-03-07 ENCOUNTER — Telehealth: Payer: Self-pay | Admitting: Family Medicine

## 2016-03-07 MED ORDER — OSELTAMIVIR PHOSPHATE 75 MG PO CAPS: 75.0000 mg | ORAL_CAPSULE | Freq: Two times a day (BID) | ORAL | 0 refills | Status: DC

## 2016-03-07 NOTE — Telephone Encounter (Signed)
Nurse Assessment  Nurse: Julien Girt, RN, Almyra Free Date/Time Eilene Ghazi Time): 03/07/2016 10:55:25 AM  Confirm and document reason for call. If symptomatic, describe symptoms. ---Caller states she thinks she might have the flu, Temp is 99/O, she has a headache, body aches and she is coughing. Her sx began on Monday and she has been exposed by 2 fellow employees.  Does the patient have any new or worsening symptoms? ---Yes  Will a triage be completed? ---Yes  Related visit to physician within the last 2 weeks? ---No  Does the PT have any chronic conditions? (i.e. diabetes, asthma, etc.) ---Yes  List chronic conditions. ---Allergic: NSAIDS, Exercise induced asthma, Hyperlipidemia  Is the patient pregnant or possibly pregnant? (Ask all females between the ages of 75-55) ---No  Is this a behavioral health or substance abuse call? ---No     Guidelines      Guideline Title Affirmed Question Affirmed Notes Nurse Date/Time (Eastern Time)  Influenza - Seasonal [1] Probable influenza (fever) with no complications AND [id number] NOT HIGH RISK (all triage questions negative)  Julien Girt, RN, Almyra Free 03/07/2016 11:03:18 AM  Asthma Attack [1] Influenza prevalent in community (or household) AND [2] flu symptoms (e.g., cough WITH fever, etc) AND [3] onset < 48 hours ago  Chancy Hurter 03/07/2016 11:09:54 AM   Disp. Time Eilene Ghazi Time) Disposition Final User   03/07/2016 9:25:43 AM Send To Clinical Follow Up Queue  Clayton Lefort [facility]   03/07/2016 11:09:41 [place], RN, Almyra Free    03/07/2016 11:12:42 AM Call PCP Now Yes Julien Girt, RN, Dagoberto Reef Understands: Yes  Disagree/Comply: Helyn Numbers Understands: Yes  Disagree/Comply: Comply     Care Advice Given Per Guideline   Will call office regarding Asthma outcome

## 2016-03-07 NOTE — Telephone Encounter (Signed)
Almyra Free nurse with [facility] called to let Dr Glori Bickers know that pt has been exposed to the flu and has exercise induced asthma; due to asthma do you want pt on Tamiflu?

## 2016-03-07 NOTE — Telephone Encounter (Signed)
Sent tamiflu to her [facility] F/u if symptoms worsen

## 2016-03-07 NOTE — Telephone Encounter (Signed)
Pt notified Rx sent and advise to f/u if sxs worsen or don't improve pt verbalized understanding

## 2016-03-08 ENCOUNTER — Ambulatory Visit: Payer: BLUE CROSS/BLUE SHIELD

## 2016-03-15 ENCOUNTER — Ambulatory Visit (INDEPENDENT_AMBULATORY_CARE_PROVIDER_SITE_OTHER): Payer: BLUE CROSS/BLUE SHIELD | Admitting: Family Medicine

## 2016-03-15 VITALS — BP 128/70 | HR 84 | Temp 98.4°F | Ht 68.0 in | Wt 250.8 lb

## 2016-03-15 DIAGNOSIS — E781 Pure hyperglyceridemia: Secondary | ICD-10-CM | POA: Diagnosis not present

## 2016-03-15 DIAGNOSIS — Z6835 Body mass index (BMI) 35.0-35.9, adult: Secondary | ICD-10-CM | POA: Insufficient documentation

## 2016-03-15 DIAGNOSIS — F17200 Nicotine dependence, unspecified, uncomplicated: Secondary | ICD-10-CM

## 2016-03-15 DIAGNOSIS — E669 Obesity, unspecified: Secondary | ICD-10-CM

## 2016-03-15 DIAGNOSIS — Z6834 Body mass index (BMI) 34.0-34.9, adult: Secondary | ICD-10-CM | POA: Insufficient documentation

## 2016-03-15 DIAGNOSIS — R238 Other skin changes: Secondary | ICD-10-CM

## 2016-03-15 DIAGNOSIS — J111 Influenza due to unidentified influenza virus with other respiratory manifestations: Secondary | ICD-10-CM | POA: Diagnosis not present

## 2016-03-15 DIAGNOSIS — T148XXA Other injury of unspecified body region, initial encounter: Secondary | ICD-10-CM | POA: Insufficient documentation

## 2016-03-15 DIAGNOSIS — E6609 Other obesity due to excess calories: Secondary | ICD-10-CM | POA: Insufficient documentation

## 2016-03-15 DIAGNOSIS — E782 Mixed hyperlipidemia: Secondary | ICD-10-CM

## 2016-03-15 NOTE — Progress Notes (Signed)
Subjective:    Patient ID: [name], female    DOB: [birth year], 56 y.o.   MRN: [id number]  HPI Here with c/o of blisters between her fingers  She thinks this started with the initiation of lopid for triglycerides  Started with big blisters on both palms  Stopped med and it stopped  Then took a pill and she got a blister in web space  Went to urgent care for one episode-prednisone and benadryl  Very painful     Also hoarse voice , no st now  Ended up with the full blown flu Took the tamiflu  Still cannot smell anything  Cough - is minimal with mucinex  No fever since the beginning    Smoking status =is about the same 3-4 cig per day  Less when she was sick  Did not tolerate vape  Did not tolerate wellbutrin - it made her mood bad      Lab Results  Component Value Date   CHOL 273 (H) 04/30/2015   HDL 30.90 (L) 04/30/2015   LDLDIRECT 147.0 04/30/2015   TRIG (H) 04/30/2015    544.0 Triglyceride is over 400; calculations on Lipids are invalid.   CHOLHDL 9 04/30/2015  this was down from over 1000 without medication     Patient Active Problem List   Diagnosis Date Noted  . Influenza 03/15/2016  . Blisters of multiple sites 03/15/2016  . Obesity (BMI 30-39.9) 03/15/2016  . Hypertriglyceridemia 03/15/2016  . Leukocytosis 04/29/2015  . Fatigue 02/10/2015  . Menopausal symptoms 02/10/2015  . Knee pain, right 05/27/2013  . Colon cancer screening 04/01/2013  . Obesity, unspecified 07/23/2012  . Other malaise and fatigue 07/23/2012  . Weight gain 04/07/2011  . Adverse effects of medication 04/07/2011  . Pedal edema 04/07/2011  . Shortness of breath dyspnea 02/01/2011  . Chest tightness 02/01/2011  . Hyperlipidemia 12/02/2010  . Other screening mammogram 11/17/2010  . FIBROIDS, UTERUS 03/01/2007  . PERIMENOPAUSAL STATUS 02/22/2007  . ABFND, RADIOLOGICAL, BREAST NEC 08/21/2006  . Smoking 08/15/2006  . DEPRESSION 06/15/2006   Past Medical History:  Diagnosis  Date  . Depressive disorder, not elsewhere classified   . Elevated blood pressure reading without diagnosis of hypertension   . Leiomyoma of uterus, unspecified   . Other (abnormal) findings on radiological examination of breast   . Pure hypercholesterolemia   . Tobacco use disorder    Past Surgical History:  Procedure Laterality Date  . BLADDER SUSPENSION    . BUNIONECTOMY    . PARTIAL HYSTERECTOMY     one ovary remains  . VAGINAL HYSTERECTOMY  2008   also had bladder tacked   Social History  Substance Use Topics  . Smoking status: Current Every Day Smoker    Packs/day: 0.25    Types: Cigarettes  . Smokeless tobacco: Not on file  . Alcohol use No     Comment: once a month   Family History  Problem Relation Age of Onset  . Cancer Mother     lung cancer  . COPD Father   . Breast cancer Maternal Grandmother    Allergies  Allergen Reactions  . Midol [Aspirin-Cinnamedrine-Caffeine] Shortness Of Breath  . Acetaminophen     REACTION: dizzy  . Bupropion Hcl     REACTION: made worse  . Hydrocodone     n/v  . Ibuprofen   . Lopid [Gemfibrozil]     Caused blisters   . Penicillins     REACTION: dizzy   Current  Outpatient Prescriptions on File Prior to Visit  Medication Sig Dispense Refill  . albuterol (PROVENTIL HFA;VENTOLIN HFA) 108 (90 Base) MCG/ACT inhaler 2 puffs before exercise or when short of breath 1 Inhaler 11  . furosemide (LASIX) 20 MG tablet TAKE 1 TABLET (20 MG TOTAL) BY MOUTH 2 (TWO) TIMES DAILY AS NEEDED. 60 tablet 11  . [DISCONTINUED] albuterol-ipratropium (COMBIVENT) 18-103 MCG/ACT inhaler Inhale 2 puffs into the lungs every 6 (six) hours as needed for wheezing. 1 Inhaler 6  . [DISCONTINUED] budesonide-formoterol (SYMBICORT) 80-4.5 MCG/ACT inhaler Inhale 2 puffs into the lungs 2 (two) times daily. 1 Inhaler 11   No current facility-administered medications on file prior to visit.     Review of Systems Review of Systems  Constitutional: Negative for  fever, appetite change,  and unexpected weight change.  Eyes: Negative for pain and visual disturbance.  ENT pos for rhinorrhea and pnd, neg for sinus pain  Respiratory: Negative for wheeze and shortness of breath.   Cardiovascular: Negative for cp or palpitations    Gastrointestinal: Negative for nausea, diarrhea and constipation.  Genitourinary: Negative for urgency and frequency.  Skin: Negative for pallor or rash  pos for blisters on hands  Neurological: Negative for weakness, light-headedness, numbness and headaches.  Hematological: Negative for adenopathy. Does not bruise/bleed easily.  Psychiatric/Behavioral: Negative for dysphoric mood. The patient is not nervous/anxious.         Objective:   Physical Exam  Constitutional: She appears well-developed and well-nourished. No distress.  obese and well appearing   HENT:  Head: Normocephalic and atraumatic.  Right Ear: External ear normal.  Left Ear: External ear normal.  Mouth/Throat: Oropharynx is clear and moist.  Nares are injected and congested  No sinus tenderness Clear rhinorrhea and post nasal drip   Eyes: Conjunctivae and EOM are normal. Pupils are equal, round, and reactive to light. Right eye exhibits no discharge. Left eye exhibits no discharge.  Neck: Normal range of motion. Neck supple.  Cardiovascular: Normal rate and normal heart sounds.   Pulmonary/Chest: Effort normal and breath sounds normal. No respiratory distress. She has no wheezes. She has no rales. She exhibits no tenderness.  Harsh and somewhat distant bs No rales or rhonchi Good air exch  Abdominal: Bowel sounds are normal. She exhibits no distension. There is no tenderness.  Lymphadenopathy:    She has no cervical adenopathy.  Neurological: She is alert. No cranial nerve deficit.  Skin: Skin is warm and dry. No rash noted.  Blister with dark red color R hand in web space between 2nd and 3rd fingers   Some resolved blister areas on palms    Psychiatric: She has a normal mood and affect.          Assessment & Plan:   Problem List Items Addressed This Visit      Respiratory   Influenza    Improving after a week Re assuring exam today  Enc to quit smoking  mucinex dm for cough Fluids and voice rest  Update if not starting to improve in a week or if worsening          Other   Blisters of multiple sites    On hands  Seems to have improved off gemfibrozil Only one remains in R web space 2-3 fingers  Will stay off of this and update 1 mo If not improved-needs to go back to derm to disc poss of bullous pemphigoid   Otherwise will need another agent for trig  Hyperlipidemia   Hypertriglyceridemia    Appears to be allergic to gemfibrozil-though it helped  Will hold for 1 mo and update  If her symptoms do not return-will consider a different med for trig  Before tx was over 1000      Obesity (BMI 30-39.9)    Discussed how this problem influences overall health and the risks it imposes  Reviewed plan for weight loss with lower calorie diet (via better food choices and also portion control or program like weight watchers) and exercise building up to or more than 30 minutes 5 days per week including some aerobic activity   Pt is eating 1200 cal per day and biking 30 min  mt but does not loose She may be a future candidate for bariatric surgery and will return to disc when ready  Enc good food choices and grad inc in length and intensity of exercise       Smoking - Primary    Disc in detail risks of smoking and possible outcomes including copd, vascular/ heart disease, cancer , respiratory and sinus infections  Pt voices understanding Pt has cut down to 3-4 cig per day  We discussed strategies for quitting and what has failed in the past (vape with nicotine and wellbutrin) Non nicotine vap/ low dose nic patch or gum and chantix are still options) She is in preparations to decide and quit in the future

## 2016-03-15 NOTE — Assessment & Plan Note (Signed)
Improving after a week Re assuring exam today  Enc to quit smoking  mucinex dm for cough Fluids and voice rest  Update if not starting to improve in a week or if worsening

## 2016-03-15 NOTE — Assessment & Plan Note (Signed)
Discussed how this problem influences overall health and the risks it imposes  Reviewed plan for weight loss with lower calorie diet (via better food choices and also portion control or program like weight watchers) and exercise building up to or more than 30 minutes 5 days per week including some aerobic activity   Pt is eating 1200 cal per day and biking 30 min  mt but does not loose She may be a future candidate for bariatric surgery and will return to disc when ready  Enc good food choices and grad inc in length and intensity of exercise

## 2016-03-15 NOTE — Progress Notes (Signed)
Pre visit review using our clinic review tool, if applicable. No additional management support is needed unless otherwise documented below in the visit note. 

## 2016-03-15 NOTE — Assessment & Plan Note (Signed)
Appears to be allergic to gemfibrozil-though it helped  Will hold for 1 mo and update  If her symptoms do not return-will consider a different med for trig  Before tx was over 1000

## 2016-03-15 NOTE — Assessment & Plan Note (Signed)
On hands  Seems to have improved off gemfibrozil Only one remains in R web space 2-3 fingers  Will stay off of this and update 1 mo If not improved-needs to go back to derm to disc poss of bullous pemphigoid   Otherwise will need another agent for trig

## 2016-03-15 NOTE — Assessment & Plan Note (Signed)
Disc in detail risks of smoking and possible outcomes including copd, vascular/ heart disease, cancer , respiratory and sinus infections  Pt voices understanding Pt has cut down to 3-4 cig per day  We discussed strategies for quitting and what has failed in the past (vape with nicotine and wellbutrin) Non nicotine vap/ low dose nic patch or gum and chantix are still options) She is in preparations to decide and quit in the future

## 2016-03-15 NOTE — Patient Instructions (Addendum)
I think your flu is improving  Rest your voice when you are not working Gargle with salt water Drink lots of fluids Keep cutting smoking the best you can  For itching or other allergic symptoms- try zyrtec 10 mg daily   Stay off gemfibrozil for 1 month  If blisters keep coming let me know  If they don't - alert me at the 1 month point and we will try something else for triglycerides   For smoking cessation - think about very low dose nicotine replacement (gum/patch/losenge) or a vape with no nicotine  Also toothpicks and straws and sugarless gum help as well  Chantix is an option if those don't work   (let me know)     Steps to Quit Smoking Smoking tobacco can be bad for your health. It can also affect almost every organ in your body. Smoking puts you and people around you at risk for many serious long-lasting (chronic) diseases. Quitting smoking is hard, but it is one of the best things that you can do for your health. It is never too late to quit. What are the benefits of quitting smoking? When you quit smoking, you lower your risk for getting serious diseases and conditions. They can include:  Lung cancer or lung disease.  Heart disease.  Stroke.  Heart attack.  Not being able to have children (infertility).  Weak bones (osteoporosis) and broken bones (fractures). If you have coughing, wheezing, and shortness of breath, those symptoms may get better when you quit. You may also get sick less often. If you are pregnant, quitting smoking can help to lower your chances of having a baby of low birth weight. What can I do to help me quit smoking? Talk with your doctor about what can help you quit smoking. Some things you can do (strategies) include:  Quitting smoking totally, instead of slowly cutting back how much you smoke over a period of time.  Going to in-person counseling. You are more likely to quit if you go to many counseling sessions.  Using resources and support  systems, such as:  Online chats with a Social worker.  Phone quitlines.  Printed Furniture conservator/restorer.  Support groups or group counseling.  Text messaging programs.  Mobile phone apps or applications.  Taking medicines. Some of these medicines may have nicotine in them. If you are pregnant or breastfeeding, do not take any medicines to quit smoking unless your doctor says it is okay. Talk with your doctor about counseling or other things that can help you. Talk with your doctor about using more than one strategy at the same time, such as taking medicines while you are also going to in-person counseling. This can help make quitting easier. What things can I do to make it easier to quit? Quitting smoking might feel very hard at first, but there is a lot that you can do to make it easier. Take these steps:  Talk to your family and friends. Ask them to support and encourage you.  Call phone quitlines, reach out to support groups, or work with a Social worker.  Ask people who smoke to not smoke around you.  Avoid places that make you want (trigger) to smoke, such as:  Bars.  Parties.  Smoke-break areas at work.  Spend time with people who do not smoke.  Lower the stress in your life. Stress can make you want to smoke. Try these things to help your stress:  Getting regular exercise.  Deep-breathing exercises.  Yoga.  Meditating.  Doing a body scan. To do this, close your eyes, focus on one area of your body at a time from head to toe, and notice which parts of your body are tense. Try to relax the muscles in those areas.  Download or buy apps on your mobile phone or tablet that can help you stick to your quit plan. There are many free apps, such as QuitGuide from the [employer] Office manager for Disease Control and Prevention). You can find more support from smokefree.gov and other websites. This information is not intended to replace advice given to you by your health care provider. Make sure  you discuss any questions you have with your health care provider. Document Released: 11/05/2008 Document Revised: 09/07/2015 Document Reviewed: 05/26/2014 Elsevier Interactive Patient Education  2017 [employer].

## 2016-03-20 ENCOUNTER — Encounter: Payer: Self-pay | Admitting: Family Medicine

## 2016-03-23 ENCOUNTER — Ambulatory Visit
Admission: RE | Admit: 2016-03-23 | Discharge: 2016-03-23 | Disposition: A | Discharge status: Home / Self Care | Payer: BLUE CROSS/BLUE SHIELD | Source: Ambulatory Visit | Attending: Family Medicine | Admitting: Family Medicine

## 2016-03-23 DIAGNOSIS — Z1231 Encounter for screening mammogram for malignant neoplasm of breast: Secondary | ICD-10-CM | POA: Diagnosis not present

## 2016-03-27 ENCOUNTER — Encounter: Payer: Self-pay | Admitting: Family Medicine

## 2016-03-29 ENCOUNTER — Ambulatory Visit: Payer: BLUE CROSS/BLUE SHIELD | Admitting: Family Medicine

## 2016-06-03 ENCOUNTER — Other Ambulatory Visit: Payer: Self-pay | Admitting: Family Medicine

## 2016-06-05 ENCOUNTER — Encounter: Payer: Self-pay | Admitting: Family Medicine

## 2016-06-08 ENCOUNTER — Encounter: Payer: Self-pay | Admitting: Family Medicine

## 2016-08-02 ENCOUNTER — Other Ambulatory Visit: Payer: Self-pay | Admitting: Family Medicine

## 2016-08-02 ENCOUNTER — Encounter: Payer: Self-pay | Admitting: Family Medicine

## 2016-08-02 MED ORDER — FUROSEMIDE 20 MG PO TABS: ORAL_TABLET | ORAL | 1 refills | Status: DC

## 2016-08-09 ENCOUNTER — Encounter: Payer: Self-pay | Admitting: Family Medicine

## 2016-08-11 MED ORDER — FENOFIBRATE 145 MG PO TABS
145.0000 mg | ORAL_TABLET | Freq: Every day | ORAL | 11 refills | Status: DC
Start: 2016-08-11 — End: 2019-05-13

## 2016-08-11 NOTE — Telephone Encounter (Signed)
I

## 2016-10-13 ENCOUNTER — Ambulatory Visit: Payer: BLUE CROSS/BLUE SHIELD | Admitting: Family Medicine

## 2016-10-23 ENCOUNTER — Encounter: Payer: Self-pay | Admitting: Family Medicine

## 2016-12-12 ENCOUNTER — Telehealth: Payer: Self-pay | Admitting: Family Medicine

## 2016-12-12 NOTE — Telephone Encounter (Signed)
Pt has called back and been scheduled.

## 2016-12-12 NOTE — Telephone Encounter (Signed)
Spoke to pt and advised she will need an Ov to start new medication. Pt states she is unable to schedule at this time; but will contact office back should she need to

## 2016-12-12 NOTE — Telephone Encounter (Signed)
Copied from [place] [id number]. Topic: Quick Communication - See Telephone Encounter >> Dec 12, 2016 11:39 AM Ether Griffins B wrote: CRM for notification. See Telephone encounter for:  Pts son is in the ER and her nerves are tore up. Pt was wanting to know if something can be called in to help with her nerves.  12/12/16.

## 2016-12-22 ENCOUNTER — Ambulatory Visit: Payer: BLUE CROSS/BLUE SHIELD | Admitting: Family Medicine

## 2017-02-07 ENCOUNTER — Other Ambulatory Visit: Payer: Self-pay | Admitting: Family Medicine

## 2017-02-07 DIAGNOSIS — Z1231 Encounter for screening mammogram for malignant neoplasm of breast: Secondary | ICD-10-CM

## 2017-03-01 ENCOUNTER — Encounter: Payer: Self-pay | Admitting: Family Medicine

## 2017-03-23 ENCOUNTER — Ambulatory Visit: Payer: Self-pay | Admitting: Family Medicine

## 2017-03-26 ENCOUNTER — Inpatient Hospital Stay: Admission: RE | Admit: 2017-03-26 | Payer: BLUE CROSS/BLUE SHIELD | Source: Ambulatory Visit

## 2017-04-12 ENCOUNTER — Ambulatory Visit
Admission: RE | Admit: 2017-04-12 | Discharge: 2017-04-12 | Disposition: A | Discharge status: Home / Self Care | Payer: BLUE CROSS/BLUE SHIELD | Source: Ambulatory Visit | Attending: Family Medicine | Admitting: Family Medicine

## 2017-04-12 DIAGNOSIS — Z1231 Encounter for screening mammogram for malignant neoplasm of breast: Secondary | ICD-10-CM | POA: Diagnosis not present

## 2017-05-09 ENCOUNTER — Encounter: Payer: Self-pay | Admitting: Family Medicine

## 2017-05-16 ENCOUNTER — Encounter: Payer: Self-pay | Admitting: Family Medicine

## 2017-05-16 ENCOUNTER — Ambulatory Visit: Payer: BLUE CROSS/BLUE SHIELD | Admitting: Family Medicine

## 2017-05-16 VITALS — BP 130/78 | HR 95 | Temp 98.3°F | Ht 68.0 in | Wt 242.0 lb

## 2017-05-16 DIAGNOSIS — K219 Gastro-esophageal reflux disease without esophagitis: Secondary | ICD-10-CM | POA: Diagnosis not present

## 2017-05-16 DIAGNOSIS — E669 Obesity, unspecified: Secondary | ICD-10-CM | POA: Diagnosis not present

## 2017-05-16 DIAGNOSIS — F17200 Nicotine dependence, unspecified, uncomplicated: Secondary | ICD-10-CM

## 2017-05-16 DIAGNOSIS — E782 Mixed hyperlipidemia: Secondary | ICD-10-CM | POA: Diagnosis not present

## 2017-05-16 DIAGNOSIS — E781 Pure hyperglyceridemia: Secondary | ICD-10-CM | POA: Diagnosis not present

## 2017-05-16 MED ORDER — RANITIDINE HCL 150 MG PO CAPS: 150.0000 mg | ORAL_CAPSULE | Freq: Two times a day (BID) | ORAL | 11 refills | Status: DC

## 2017-05-16 NOTE — Patient Instructions (Addendum)
I will track down a cardiologist to get advice about triglycerides   Keep up the good work with weight loss   Continue the ranitidine 150  Twice daily  Watch diet- look at handout  Avoid carbonation  Avoid food/drink right before bed

## 2017-05-16 NOTE — Progress Notes (Signed)
Subjective:    Patient ID: [name], female    DOB: 1960-10-21, 57 y.o.   MRN: [id number]  HPI Here for GERD and hyperlipidemia and knee pain   GERD Gets up in the middle of the night  Nausea and sick feeling  Bought 150 mg of ranitidine otc and takes bid  Symptoms have improved 90%  She also watches diet  Triggers: cucumbers, diet soda, and pizza (any kind of red sauce) and OJ  No abd pain  No bloody or black stool   Very tired  Some exercise - is using stationary bike  Also eating healthier   Has arthritis in both knees  Takes tylenol and not nsaids   Wt Readings from Last 3 Encounters:  05/16/17 242 lb (109.8 kg)  03/15/16 250 lb 12 oz (113.7 kg)  02/10/15 250 lb (113.4 kg)  down 8 lb  36.80 kg/m   Smoking status  Still smokes Not ready to quit yet  She tried wellbutrin in the past and it caused severe depression   Lab Results  Component Value Date   CHOL 273 (H) 04/30/2015   HDL 30.90 (L) 04/30/2015   LDLDIRECT 147.0 04/30/2015   TRIG (H) 04/30/2015    544.0 Triglyceride is over 400; calculations on Lipids are invalid.   CHOLHDL 9 04/30/2015   Hx of high trig  On fenofibrate - hive  Was allergic to gemfibrozil- hives also  Started fish oil- that gave her nausea and diarrhea  Went to the lipid clinic and they could not find anything they can do    Patient Active Problem List   Diagnosis Date Noted  . GERD (gastroesophageal reflux disease) 05/16/2017  . Blisters of multiple sites 03/15/2016  . Obesity (BMI 30-39.9) 03/15/2016  . Hypertriglyceridemia 03/15/2016  . Leukocytosis 04/29/2015  . Fatigue 02/10/2015  . Menopausal symptoms 02/10/2015  . Knee pain, right 05/27/2013  . Colon cancer screening 04/01/2013  . Obesity, unspecified 07/23/2012  . Other malaise and fatigue 07/23/2012  . Weight gain 04/07/2011  . Adverse effects of medication 04/07/2011  . Pedal edema 04/07/2011  . Shortness of breath dyspnea 02/01/2011  . Chest tightness  02/01/2011  . Hyperlipidemia 12/02/2010  . Other screening mammogram 11/17/2010  . FIBROIDS, UTERUS 03/01/2007  . PERIMENOPAUSAL STATUS 02/22/2007  . ABFND, RADIOLOGICAL, BREAST NEC 08/21/2006  . Smoking 08/15/2006  . DEPRESSION 06/15/2006   Past Medical History:  Diagnosis Date  . Depressive disorder, not elsewhere classified   . Elevated blood pressure reading without diagnosis of hypertension   . Leiomyoma of uterus, unspecified   . Other (abnormal) findings on radiological examination of breast   . Pure hypercholesterolemia   . Tobacco use disorder    Past Surgical History:  Procedure Laterality Date  . BLADDER SUSPENSION    . BUNIONECTOMY    . PARTIAL HYSTERECTOMY     one ovary remains  . VAGINAL HYSTERECTOMY  2008   also had bladder tacked   Social History   Tobacco Use  . Smoking status: Current Every Day Smoker    Packs/day: 0.25    Types: Cigarettes  . Smokeless tobacco: Never Used  Substance Use Topics  . Alcohol use: No    Alcohol/week: 0.0 oz    Comment: once a month  . Drug use: No   Family History  Problem Relation Age of Onset  . Cancer Mother        lung cancer  . COPD Father   . Breast cancer Maternal  Grandmother    Allergies  Allergen Reactions  . Midol [Aspirin-Cinnamedrine-Caffeine] Shortness Of Breath  . Acetaminophen     REACTION: dizzy  . Bupropion Hcl     REACTION: made worse  . Hydrocodone     n/v  . Ibuprofen   . Lopid [Gemfibrozil]     Caused blisters   . Penicillins     REACTION: dizzy   Current Outpatient Medications on File Prior to Visit  Medication Sig Dispense Refill  . furosemide (LASIX) 20 MG tablet take 1 tablet by mouth twice a day if needed for edema (Patient taking differently: Take 40 mg by mouth daily. ) 60 tablet 1  . fenofibrate (TRICOR) 145 MG tablet Take 1 tablet (145 mg total) by mouth daily. (Patient not taking: Reported on 05/16/2017) 30 tablet 11  . [DISCONTINUED] albuterol-ipratropium (COMBIVENT)  18-103 MCG/ACT inhaler Inhale 2 puffs into the lungs every 6 (six) hours as needed for wheezing. 1 Inhaler 6  . [DISCONTINUED] budesonide-formoterol (SYMBICORT) 80-4.5 MCG/ACT inhaler Inhale 2 puffs into the lungs 2 (two) times daily. 1 Inhaler 11   No current facility-administered medications on file prior to visit.     Review of Systems  Constitutional: Negative for activity change, appetite change, fatigue, fever and unexpected weight change.  HENT: Negative for congestion, ear pain, rhinorrhea, sinus pressure and sore throat.   Eyes: Negative for pain, redness and visual disturbance.  Respiratory: Negative for cough, shortness of breath and wheezing.   Cardiovascular: Negative for chest pain and palpitations.  Gastrointestinal: Negative for abdominal pain, blood in stool, constipation, diarrhea, nausea, rectal pain and vomiting.       Heartburn/reflux   Endocrine: Negative for polydipsia and polyuria.  Genitourinary: Negative for dysuria, frequency and urgency.  Musculoskeletal: Negative for arthralgias, back pain and myalgias.  Skin: Negative for pallor and rash.  Allergic/Immunologic: Negative for environmental allergies.  Neurological: Negative for dizziness, syncope and headaches.  Hematological: Negative for adenopathy. Does not bruise/bleed easily.  Psychiatric/Behavioral: Negative for decreased concentration and dysphoric mood. The patient is not nervous/anxious.        Objective:   Physical Exam  Constitutional: She appears well-developed and well-nourished. No distress.  obese and well appearing   HENT:  Head: Normocephalic and atraumatic.  Mouth/Throat: Oropharynx is clear and moist.  Eyes: Pupils are equal, round, and reactive to light. Conjunctivae and EOM are normal. No scleral icterus.  Neck: Normal range of motion. Neck supple.  Cardiovascular: Normal rate, regular rhythm and normal heart sounds.  Pulmonary/Chest: Effort normal and breath sounds normal. No  respiratory distress. She has no wheezes. She has no rales.  Diffusely distant bs   Abdominal: Soft. Bowel sounds are normal. She exhibits no distension and no mass. There is no tenderness. There is no rebound and no guarding.  Lymphadenopathy:    She has no cervical adenopathy.  Neurological: She is alert.  Skin: Skin is warm and dry. No erythema. No pallor.  Psychiatric: She has a normal mood and affect.          Assessment & Plan:   Problem List Items Addressed This Visit      Digestive   GERD (gastroesophageal reflux disease) - Primary    Improved with ranitidine and diet change/also wt loss Handouts given on gerd and diet  Px ranitidine 150 mg bid  Disc smaller meals and not eating/drinking before bed Can try elevating head of bed  Keep working on wt loss  Update if no further imp -would consider  PPI      Relevant Medications   ranitidine (ZANTAC) 150 MG capsule     Other   Hyperlipidemia    Disc goals for lipids and reasons to control them Rev labs with pt Rev low sat fat diet in detail   Has very high triglycerides and allergic to trig meds       Hypertriglyceridemia    Pt has hx of very high triglycerides Allergic to fenofibrate and also gemfibrozil (both cause hives)  Went to lipid clinic years ago  Unsure if she has other options for tx  Is watching diet closely      Obesity (BMI 30-39.9)    Doing well with wt loss -with diet and exercise  Discussed how this problem influences overall health and the risks it imposes  Reviewed plan for weight loss with lower calorie diet (via better food choices and also portion control or program like weight watchers) and exercise building up to or more than 30 minutes 5 days per week including some aerobic activity         Smoking    Disc in detail risks of smoking and possible outcomes including copd, vascular/ heart disease, cancer , respiratory and sinus infections  Pt voices understanding She is not ready to  quit

## 2017-05-17 NOTE — Assessment & Plan Note (Signed)
Doing well with wt loss -with diet and exercise  Discussed how this problem influences overall health and the risks it imposes  Reviewed plan for weight loss with lower calorie diet (via better food choices and also portion control or program like weight watchers) and exercise building up to or more than 30 minutes 5 days per week including some aerobic activity

## 2017-05-17 NOTE — Assessment & Plan Note (Signed)
Disc goals for lipids and reasons to control them Rev labs with pt Rev low sat fat diet in detail   Has very high triglycerides and allergic to trig meds

## 2017-05-17 NOTE — Assessment & Plan Note (Signed)
Pt has hx of very high triglycerides Allergic to fenofibrate and also gemfibrozil (both cause hives)  Went to lipid clinic years ago  Unsure if she has other options for tx  Is watching diet closely

## 2017-05-17 NOTE — Assessment & Plan Note (Signed)
Disc in detail risks of smoking and possible outcomes including copd, vascular/ heart disease, cancer , respiratory and sinus infections  Pt voices understanding She is not ready to quit  

## 2017-05-17 NOTE — Assessment & Plan Note (Signed)
Improved with ranitidine and diet change/also wt loss Handouts given on gerd and diet  Px ranitidine 150 mg bid  Disc smaller meals and not eating/drinking before bed Can try elevating head of bed  Keep working on wt loss  Update if no further imp -would consider PPI

## 2017-05-21 ENCOUNTER — Encounter: Payer: Self-pay | Admitting: Family Medicine

## 2017-05-22 MED ORDER — NIACIN ER (ANTIHYPERLIPIDEMIC) 500 MG PO TBCR: 500.0000 mg | EXTENDED_RELEASE_TABLET | Freq: Every day | ORAL | 11 refills | Status: DC

## 2017-05-22 NOTE — Telephone Encounter (Signed)
Called pt and no answer and pt's VM box was full so I sent her a mychart message letting her know Dr. Marliss Coots comments

## 2017-05-22 NOTE — Telephone Encounter (Signed)
Sent niaspan to pt's pharmacy Please let her know I sent it   Schedule fasting labs for lipid profile in about a month Keep watching diet   Let her know that the niaspan can cause flushing - but this is lessened if she can take a baby aspirin (with food) about 1/2 hour before she takes the pill at bedtime   If she is intolerant to aspirin-disregard that  Alert me if any problems or side effects   Thanks

## 2017-05-23 ENCOUNTER — Encounter (INDEPENDENT_AMBULATORY_CARE_PROVIDER_SITE_OTHER): Payer: Self-pay

## 2017-06-12 ENCOUNTER — Encounter: Payer: Self-pay | Admitting: Family Medicine

## 2017-06-21 ENCOUNTER — Telehealth: Payer: Self-pay | Admitting: Family Medicine

## 2017-06-21 DIAGNOSIS — E781 Pure hyperglyceridemia: Secondary | ICD-10-CM

## 2017-06-21 NOTE — Telephone Encounter (Signed)
-----   Message from Ellamae Sia sent at 06/11/2017 11:04 AM EDT ----- Regarding: Lab orders for Friday, 5.31.19 Lab orders, thanks

## 2017-06-22 ENCOUNTER — Other Ambulatory Visit: Payer: BLUE CROSS/BLUE SHIELD

## 2017-06-27 ENCOUNTER — Other Ambulatory Visit: Payer: Self-pay | Admitting: Family Medicine

## 2017-08-07 ENCOUNTER — Encounter: Payer: Self-pay | Admitting: Family Medicine

## 2017-08-27 IMAGING — MG MM DIGITAL SCREENING BILAT W/ CAD
4 series · 4 of 4 positions shown · non-contrast
Comparison: Previous exam(s).

CLINICAL DATA: Screening.

EXAM:
DIGITAL SCREENING BILATERAL MAMMOGRAM WITH CAD

[R CC]
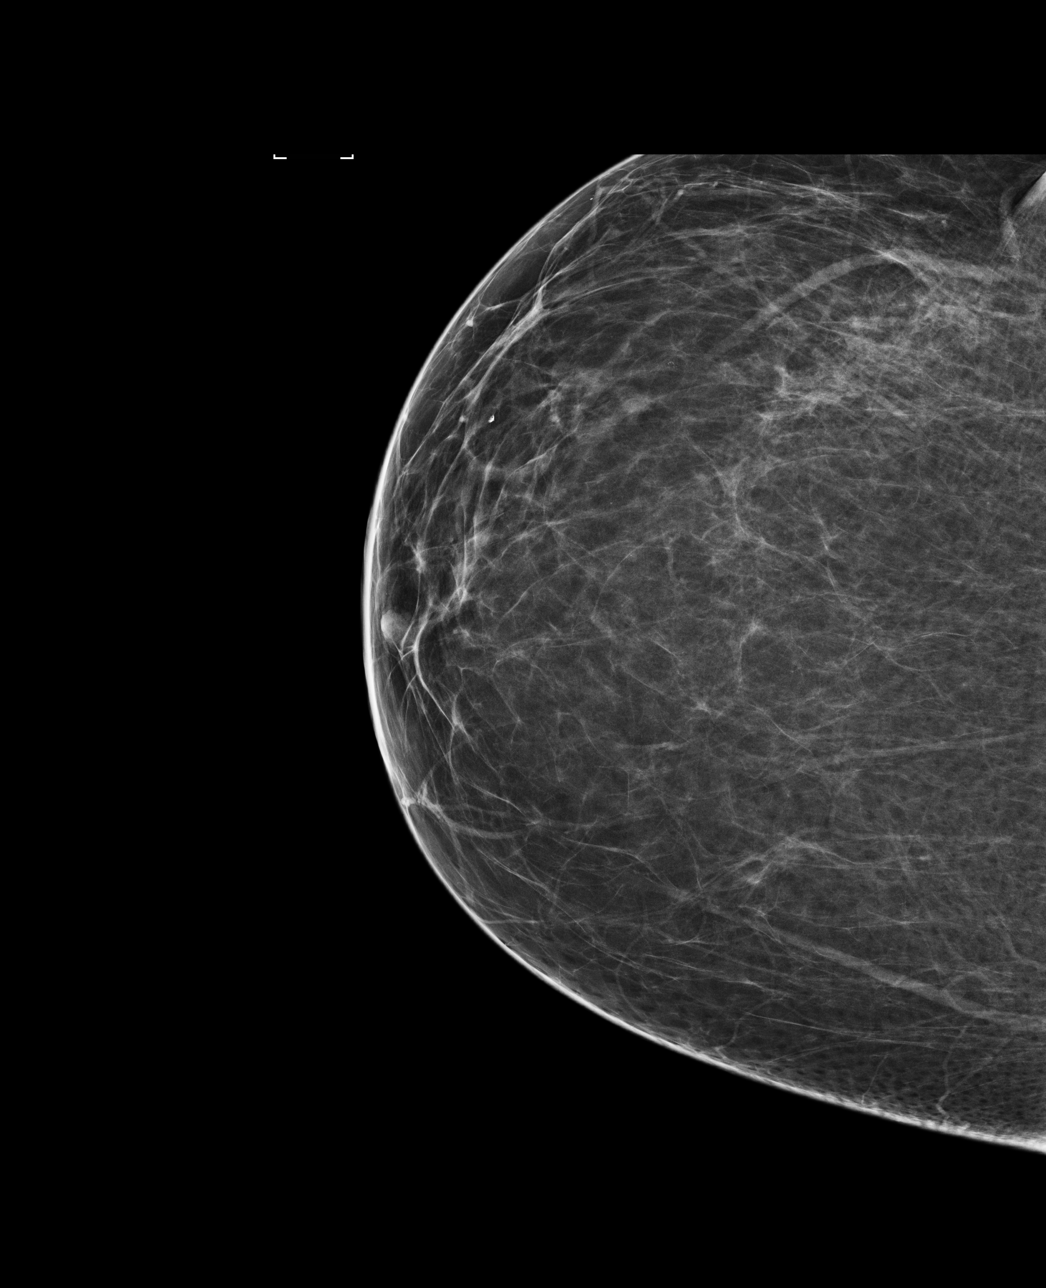

[L CC]
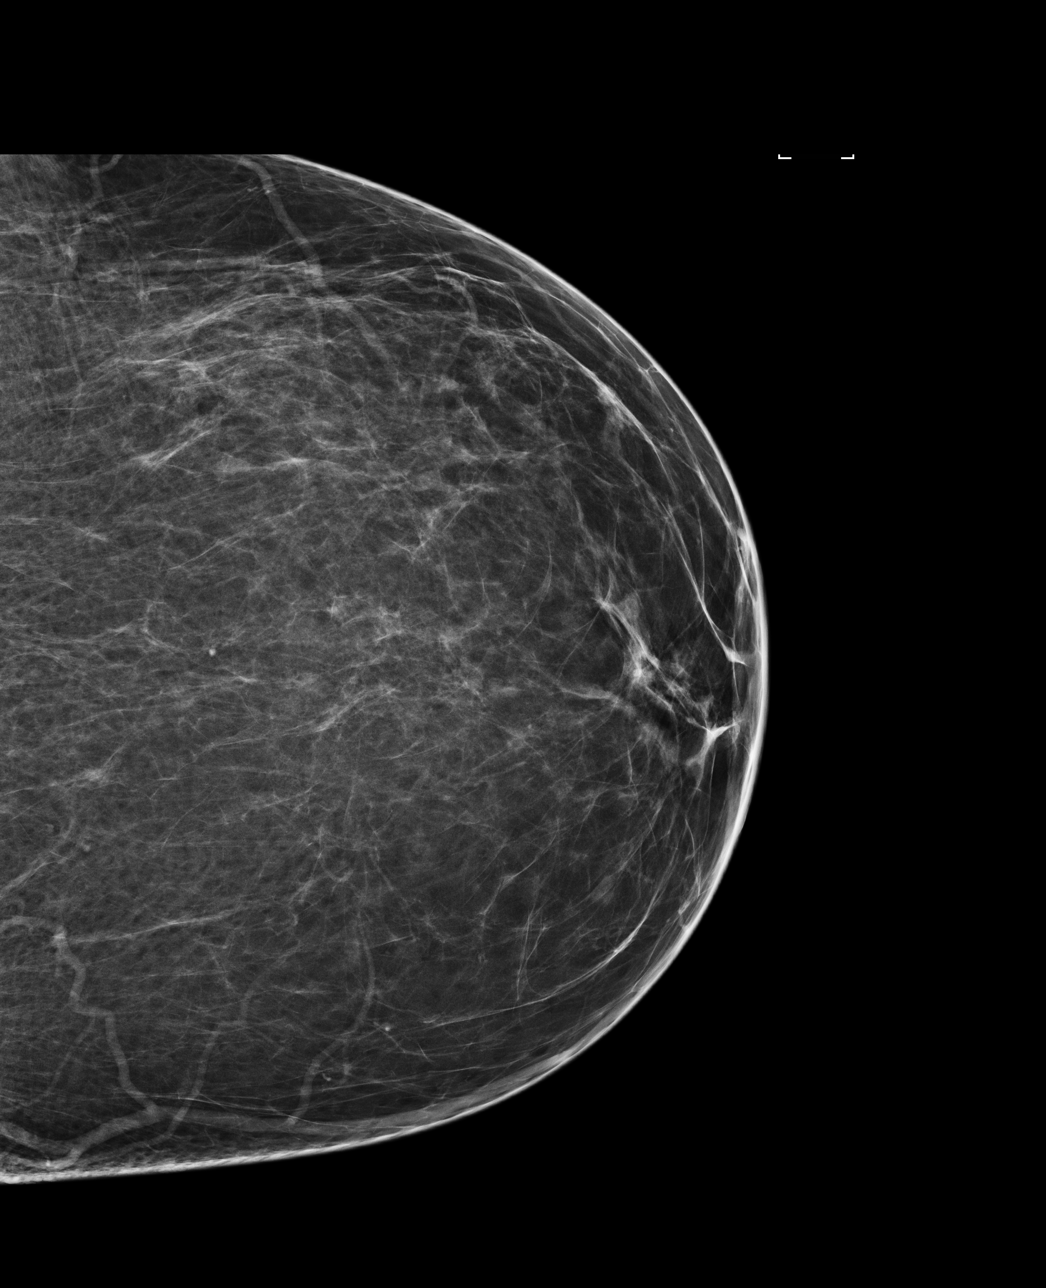

[R MLO]
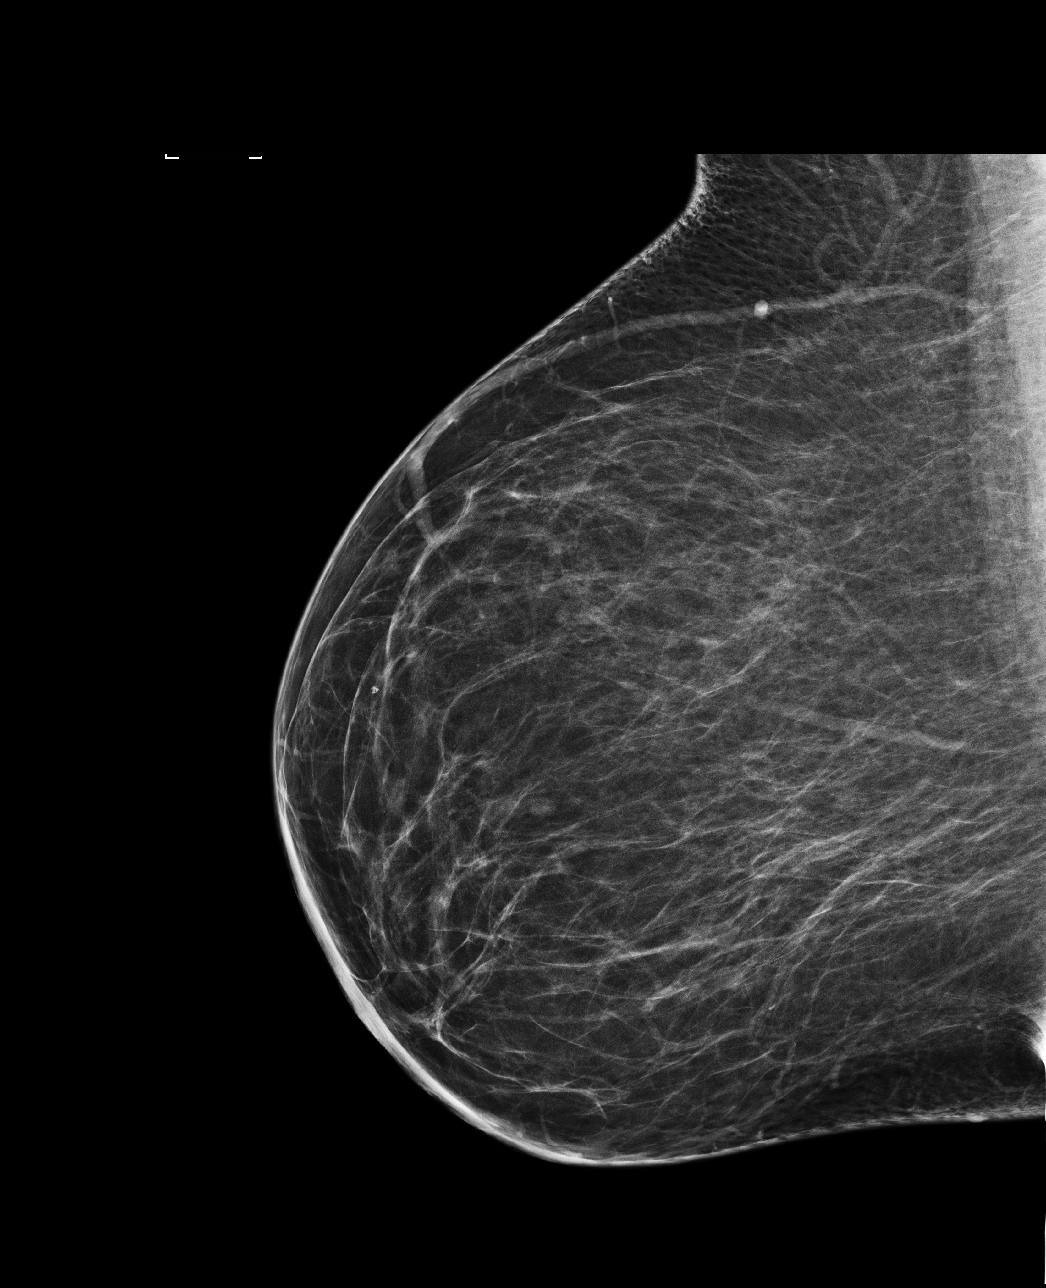

[L MLO]
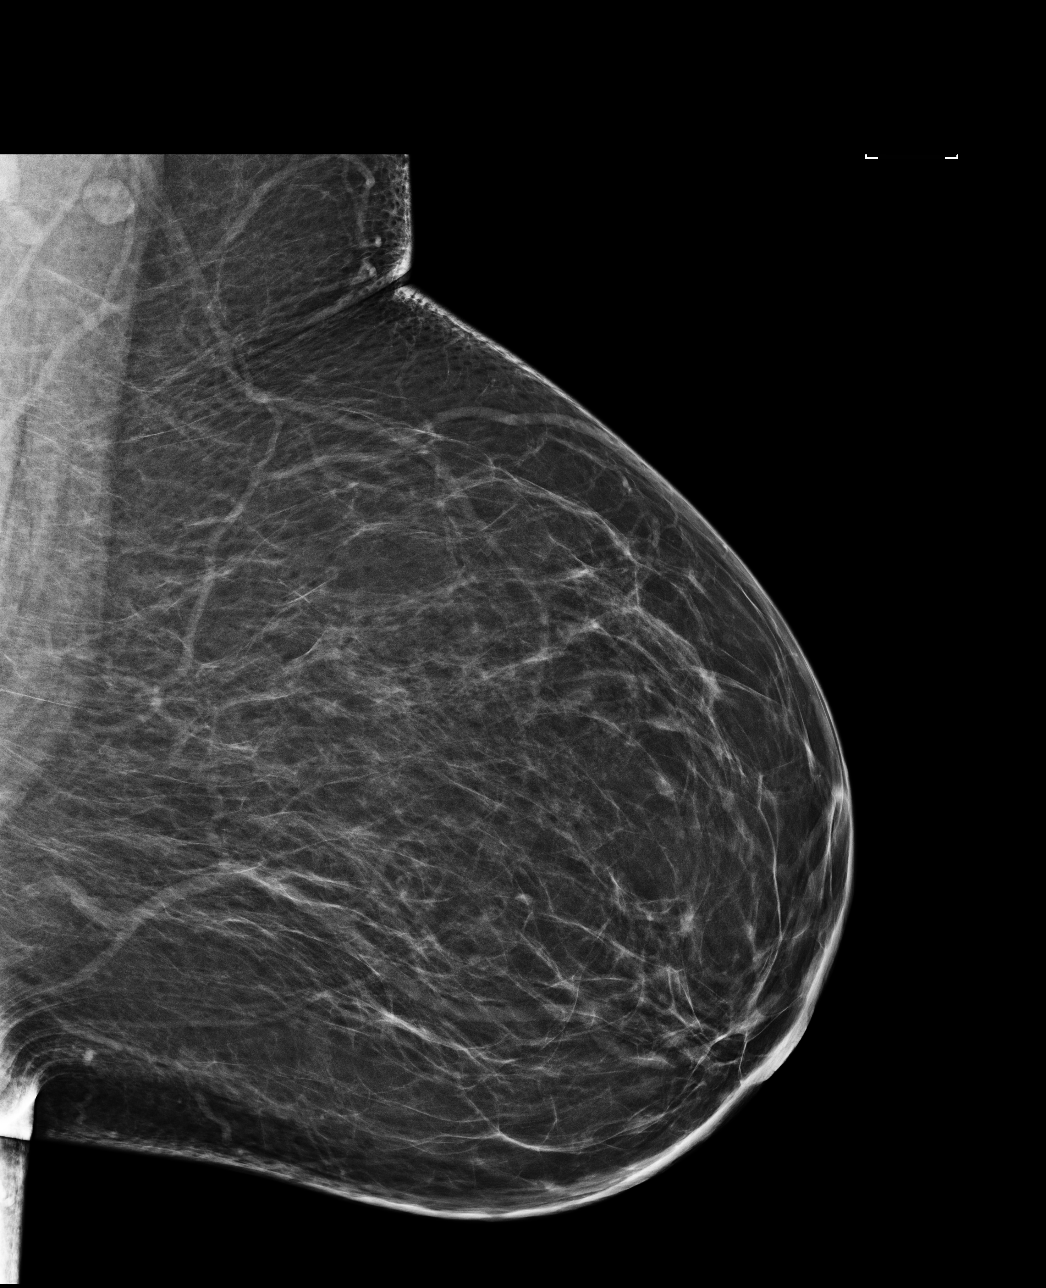

[4 of 4 positions shown; findings below may reference images not displayed]

ACR Breast Density Category b: There are scattered areas of
fibroglandular density.
FINDINGS: There are no findings suspicious for malignancy. Images were
processed with CAD.
IMPRESSION: No mammographic evidence of malignancy. A result letter of this
screening mammogram will be mailed directly to the patient.

RECOMMENDATION:
Screening mammogram in one year. (Code:AS-G-LCT)

BI-RADS CATEGORY  1: Negative.

## 2017-09-10 ENCOUNTER — Encounter: Payer: Self-pay | Admitting: Family Medicine

## 2017-09-10 MED ORDER — ALBUTEROL SULFATE HFA 108 (90 BASE) MCG/ACT IN AERS: INHALATION_SPRAY | RESPIRATORY_TRACT | 2 refills | Status: DC

## 2018-02-19 ENCOUNTER — Encounter: Payer: Self-pay | Admitting: Family Medicine

## 2018-02-21 MED ORDER — FAMOTIDINE 20 MG PO TABS: 20.0000 mg | ORAL_TABLET | Freq: Two times a day (BID) | ORAL | 11 refills | Status: DC

## 2018-03-06 ENCOUNTER — Encounter: Payer: Self-pay | Admitting: Family Medicine

## 2018-05-14 DIAGNOSIS — L723 Sebaceous cyst: Secondary | ICD-10-CM | POA: Diagnosis not present

## 2018-06-10 ENCOUNTER — Other Ambulatory Visit: Payer: Self-pay | Admitting: Family Medicine

## 2018-06-10 DIAGNOSIS — Z1231 Encounter for screening mammogram for malignant neoplasm of breast: Secondary | ICD-10-CM

## 2018-07-15 ENCOUNTER — Ambulatory Visit: Payer: BC Managed Care – PPO

## 2018-08-21 ENCOUNTER — Ambulatory Visit
Admission: RE | Admit: 2018-08-21 | Discharge: 2018-08-21 | Disposition: A | Discharge status: Home / Self Care | Payer: BC Managed Care – PPO | Source: Ambulatory Visit | Attending: Family Medicine | Admitting: Family Medicine

## 2018-08-21 ENCOUNTER — Other Ambulatory Visit: Payer: Self-pay

## 2018-08-21 DIAGNOSIS — Z1231 Encounter for screening mammogram for malignant neoplasm of breast: Secondary | ICD-10-CM | POA: Diagnosis not present

## 2018-11-14 ENCOUNTER — Encounter: Payer: Self-pay | Admitting: Family Medicine

## 2018-11-15 ENCOUNTER — Other Ambulatory Visit: Payer: Self-pay

## 2018-11-15 DIAGNOSIS — Z20828 Contact with and (suspected) exposure to other viral communicable diseases: Secondary | ICD-10-CM | POA: Diagnosis not present

## 2018-11-15 DIAGNOSIS — Z20822 Contact with and (suspected) exposure to covid-19: Secondary | ICD-10-CM

## 2018-11-17 LAB — NOVEL CORONAVIRUS, NAA: SARS-CoV-2, NAA: NOT DETECTED

## 2018-11-27 ENCOUNTER — Ambulatory Visit (INDEPENDENT_AMBULATORY_CARE_PROVIDER_SITE_OTHER): Payer: BC Managed Care – PPO

## 2018-11-27 DIAGNOSIS — Z23 Encounter for immunization: Secondary | ICD-10-CM | POA: Diagnosis not present

## 2018-11-27 NOTE — Progress Notes (Signed)
Per orders of Dr. Beryle Beams signed by Alma Friendly, NP in her absence injection of Pneumovax 23 and Flu shot given by Kris Mouton. Patient tolerated injection well.

## 2019-05-13 ENCOUNTER — Other Ambulatory Visit: Payer: Self-pay

## 2019-05-13 ENCOUNTER — Encounter: Payer: Self-pay | Admitting: Family Medicine

## 2019-05-13 ENCOUNTER — Ambulatory Visit: Payer: BC Managed Care – PPO | Admitting: Family Medicine

## 2019-05-13 VITALS — BP 136/90 | HR 94 | Temp 97.8°F | Ht 68.0 in | Wt 243.1 lb

## 2019-05-13 DIAGNOSIS — R221 Localized swelling, mass and lump, neck: Secondary | ICD-10-CM | POA: Insufficient documentation

## 2019-05-13 DIAGNOSIS — R6884 Jaw pain: Secondary | ICD-10-CM | POA: Insufficient documentation

## 2019-05-13 DIAGNOSIS — E782 Mixed hyperlipidemia: Secondary | ICD-10-CM | POA: Diagnosis not present

## 2019-05-13 DIAGNOSIS — R600 Localized edema: Secondary | ICD-10-CM

## 2019-05-13 DIAGNOSIS — R5382 Chronic fatigue, unspecified: Secondary | ICD-10-CM

## 2019-05-13 DIAGNOSIS — F17200 Nicotine dependence, unspecified, uncomplicated: Secondary | ICD-10-CM

## 2019-05-13 LAB — CBC WITH DIFFERENTIAL/PLATELET
Basophils Absolute: 0.1 10*3/uL (ref 0.0–0.1)
Basophils Relative: 0.6 % (ref 0.0–3.0)
Eosinophils Absolute: 0.1 10*3/uL (ref 0.0–0.7)
Eosinophils Relative: 1.5 % (ref 0.0–5.0)
HCT: 41.4 % (ref 36.0–46.0)
Hemoglobin: 14.2 g/dL (ref 12.0–15.0)
Lymphocytes Relative: 40.7 % (ref 12.0–46.0)
Lymphs Abs: 3.8 10*3/uL (ref 0.7–4.0)
MCHC: 34.3 g/dL (ref 30.0–36.0)
MCV: 88.7 fl (ref 78.0–100.0)
Monocytes Absolute: 0.5 10*3/uL (ref 0.1–1.0)
Monocytes Relative: 5.7 % (ref 3.0–12.0)
Neutro Abs: 4.9 10*3/uL (ref 1.4–7.7)
Neutrophils Relative %: 51.5 % (ref 43.0–77.0)
Platelets: 280 10*3/uL (ref 150.0–400.0)
RBC: 4.67 Mil/uL (ref 3.87–5.11)
RDW: 13.9 % (ref 11.5–15.5)
WBC: 9.4 10*3/uL (ref 4.0–10.5)

## 2019-05-13 LAB — COMPREHENSIVE METABOLIC PANEL
ALT: 37 U/L — ABNORMAL HIGH (ref 0–35)
AST: 19 U/L (ref 0–37)
Albumin: 4.3 g/dL (ref 3.5–5.2)
Alkaline Phosphatase: 92 U/L (ref 39–117)
BUN: 12 mg/dL (ref 6–23)
CO2: 27 mEq/L (ref 19–32)
Calcium: 9.5 mg/dL (ref 8.4–10.5)
Chloride: 100 mEq/L (ref 96–112)
Creatinine, Ser: 0.73 mg/dL (ref 0.40–1.20)
GFR: 81.93 mL/min (ref >60.00)
Glucose, Bld: 289 mg/dL — ABNORMAL HIGH (ref 70–99)
Potassium: 4.3 mEq/L (ref 3.5–5.1)
Sodium: 134 mEq/L — ABNORMAL LOW (ref 135–145)
Total Bilirubin: 0.3 mg/dL (ref 0.2–1.2)
Total Protein: 7 g/dL (ref 6.0–8.3)

## 2019-05-13 LAB — LIPID PANEL
Cholesterol: 259 mg/dL — ABNORMAL HIGH (ref 0–200)
HDL: 27 mg/dL — ABNORMAL LOW (ref >39.00)
Total CHOL/HDL Ratio: 10
Triglycerides: 1334 mg/dL — ABNORMAL HIGH (ref 0.0–149.0)

## 2019-05-13 LAB — LDL CHOLESTEROL, DIRECT: Direct LDL: 76 mg/dL

## 2019-05-13 MED ORDER — DOXYCYCLINE HYCLATE 100 MG PO TABS: 100.0000 mg | ORAL_TABLET | Freq: Two times a day (BID) | ORAL | 0 refills | Status: DC

## 2019-05-13 MED ORDER — FUROSEMIDE 20 MG PO TABS: ORAL_TABLET | ORAL | 5 refills | Status: DC

## 2019-05-13 NOTE — Assessment & Plan Note (Signed)
Takes furosemide on average QOD prn  Lab today

## 2019-05-13 NOTE — Assessment & Plan Note (Signed)
Disc in detail risks of smoking and possible outcomes including copd, vascular/ heart disease, cancer , respiratory and sinus infections  Pt voices understanding Pt is not ready to quit unfortunately

## 2019-05-13 NOTE — Patient Instructions (Signed)
For neck/jaw pain and swelling  Use a warm compress whenever you can   In case of a salivary obstruction- try sour liquids/solids to help promote salivation   See dentist if tooth starts to hurt   Take doxycycline as directed If no improvement in several days - let us know so we can refer to ENT specialty   Labs today

## 2019-05-13 NOTE — Assessment & Plan Note (Signed)
Swollen/full area just below angle of mandible Per pt -earlier also behind and in front of ear (improves after being upright) Area corresponds to both LN and salivary glands  Disc dental f/u Adv sour food-to promote salivation in case of salivary gland obst  Px docycycline to cover poss infection Cbc ordered with labs If no imp would consider ENT ref-she will update [place]

## 2019-05-13 NOTE — Assessment & Plan Note (Signed)
Cbc, cmet today with labs

## 2019-05-13 NOTE — Assessment & Plan Note (Signed)
Pain of area of angle of mandible- some swelling/usually in ams  Has a tooth s/p crown loss but no pain   Also TMJ-wears nite guard

## 2019-05-13 NOTE — Progress Notes (Signed)
Subjective:    Patient ID: [name], female    DOB: [birth year], 59 y.o.   MRN: [id number]  This visit occurred during the SARS-CoV-2 public health emergency.  Safety protocols were in place, including screening questions prior to the visit, additional usage of staff PPE, and extensive cleaning of exam room while observing appropriate contact time as indicated for disinfecting solutions.    HPI Pt presents for a painful knot behind her ear causing head pain  Also chronic medical problems   Wt Readings from Last 3 Encounters:  05/13/19 243 lb 1 oz (110.3 kg)  05/16/17 242 lb (109.8 kg)  03/15/16 250 lb 12 oz (113.7 kg)   36.96 kg/m   About 2 weeks ago -thought her TMJ was flaring up (right side) Came and went  Then woke up with hard knot around earlobe/ear   She put warm compresses on it  It went down Woke up this am and it is worse again   No warm compresses  Down a little from this am but still there  Some tenderness under her eye on the R   Ears pop a lot   Some itchy eyes from pollen  No nasal symptoms   No fever  No nausea   Is tired   Took tylenol last night (it made her nauseated)   Has a tooth missing a crown- lower molar on that side - but not painful (a little sensitive)    Smoking status -no changes    Needs lasix for swelling  It helps a lot  Generally takes it every other day   Hyperlipidemia  Unable to take cholesterol medicine-allergic  Would like to check her profile  Diet has not been good   Fatigue is her baseline   Patient Active Problem List   Diagnosis Date Noted  . Jaw pain 05/13/2019  . Localized swelling, mass and lump, neck 05/13/2019  . GERD (gastroesophageal reflux disease) 05/16/2017  . Blisters of multiple sites 03/15/2016  . Obesity (BMI 30-39.9) 03/15/2016  . Hypertriglyceridemia 03/15/2016  . Leukocytosis 04/29/2015  . Fatigue 02/10/2015  . Menopausal symptoms 02/10/2015  . Knee pain, right 05/27/2013  . Colon  cancer screening 04/01/2013  . Obesity, unspecified 07/23/2012  . Other malaise and fatigue 07/23/2012  . Weight gain 04/07/2011  . Adverse effects of medication 04/07/2011  . Pedal edema 04/07/2011  . Shortness of breath dyspnea 02/01/2011  . Chest tightness 02/01/2011  . Hyperlipidemia 12/02/2010  . Other screening mammogram 11/17/2010  . FIBROIDS, UTERUS 03/01/2007  . PERIMENOPAUSAL STATUS 02/22/2007  . ABFND, RADIOLOGICAL, BREAST NEC 08/21/2006  . Smoking 08/15/2006  . DEPRESSION 06/15/2006   Past Medical History:  Diagnosis Date  . Depressive disorder, not elsewhere classified   . Elevated blood pressure reading without diagnosis of hypertension   . Leiomyoma of uterus, unspecified   . Other (abnormal) findings on radiological examination of breast   . Pure hypercholesterolemia   . Tobacco use disorder    Past Surgical History:  Procedure Laterality Date  . BLADDER SUSPENSION    . BUNIONECTOMY    . PARTIAL HYSTERECTOMY     one ovary remains  . VAGINAL HYSTERECTOMY  2008   also had bladder tacked   Social History   Tobacco Use  . Smoking status: Current Every Day Smoker    Packs/day: 0.25    Types: Cigarettes  . Smokeless tobacco: Never Used  Substance Use Topics  . Alcohol use: No    Alcohol/week: 0.0  standard drinks    Comment: once a month  . Drug use: No   Family History  Problem Relation Age of Onset  . Cancer Mother        lung cancer  . COPD Father   . Breast cancer Maternal Grandmother    Allergies  Allergen Reactions  . Midol [Aspirin-Cinnamedrine-Caffeine] Shortness Of Breath  . Acetaminophen     REACTION: dizzy  . Bupropion Hcl     REACTION: made worse  . Hydrocodone     n/v  . Ibuprofen   . Lopid [Gemfibrozil]     Caused blisters   . Niaspan [Niacin Er]     Diarrhea   . Penicillins     REACTION: dizzy   Current Outpatient Medications on File Prior to Visit  Medication Sig Dispense Refill  . albuterol (PROVENTIL HFA;VENTOLIN  HFA) 108 (90 Base) MCG/ACT inhaler 2 puffs before exercise or as needed when short of breath 1 Inhaler 2  . [DISCONTINUED] albuterol-ipratropium (COMBIVENT) 18-103 MCG/ACT inhaler Inhale 2 puffs into the lungs every 6 (six) hours as needed for wheezing. 1 Inhaler 6  . [DISCONTINUED] budesonide-formoterol (SYMBICORT) 80-4.5 MCG/ACT inhaler Inhale 2 puffs into the lungs 2 (two) times daily. 1 Inhaler 11   No current facility-administered medications on file prior to visit.     Review of Systems  Constitutional: Positive for fatigue. Negative for activity change, appetite change, fever and unexpected weight change.       Sedentary  HENT: Positive for dental problem, ear pain and facial swelling. Negative for congestion, ear discharge, hearing loss, mouth sores, postnasal drip, rhinorrhea, sinus pressure, sneezing, sore throat, trouble swallowing and voice change.   Eyes: Negative for pain, redness and visual disturbance.  Respiratory: Negative for cough, shortness of breath and wheezing.   Cardiovascular: Negative for chest pain and palpitations.  Gastrointestinal: Negative for abdominal pain, blood in stool, constipation and diarrhea.  Endocrine: Negative for polydipsia and polyuria.  Genitourinary: Negative for dysuria, frequency and urgency.  Musculoskeletal: Negative for arthralgias, back pain and myalgias.  Skin: Negative for pallor and rash.  Allergic/Immunologic: Negative for environmental allergies.  Neurological: Negative for dizziness, syncope, light-headedness and headaches.  Hematological: Negative for adenopathy. Does not bruise/bleed easily.  Psychiatric/Behavioral: Negative for decreased concentration and dysphoric mood. The patient is not nervous/anxious.        Low motivation       Objective:   Physical Exam Constitutional:      General: She is not in acute distress.    Appearance: She is well-developed. She is obese. She is not ill-appearing or diaphoretic.  HENT:      Head: Normocephalic and atraumatic.     Comments: Tender full area just under angle of mandible on R side   No mastoid tenderness    Right Ear: Tympanic membrane, ear canal and external ear normal.     Left Ear: Tympanic membrane, ear canal and external ear normal.     Ears:     Comments: R TM is slt dull    Nose: Nose normal.     Mouth/Throat:     Mouth: Mucous membranes are moist.     Pharynx: No oropharyngeal exudate or posterior oropharyngeal erythema.  Eyes:     General:        Right eye: No discharge.        Left eye: No discharge.     Conjunctiva/sclera: Conjunctivae normal.     Pupils: Pupils are equal, round, and reactive to light.  Neck:     Comments: ? Enl LN in parotid area vs parotid gland swelling Cardiovascular:     Rate and Rhythm: Regular rhythm. Tachycardia present.     Heart sounds: Normal heart sounds.  Pulmonary:     Effort: Pulmonary effort is normal. No respiratory distress.     Breath sounds: Normal breath sounds. No wheezing or rales.     Comments: Diffusely distant bs  Musculoskeletal:        General: No deformity.     Cervical back: Normal range of motion and neck supple. No rigidity.     Right lower leg: No edema.     Left lower leg: Edema present.  Skin:    General: Skin is warm and dry.     Coloration: Skin is not jaundiced or pale.     Findings: No bruising or erythema.  Neurological:     Mental Status: She is alert.     Sensory: No sensory deficit.     Coordination: Coordination normal.  Psychiatric:        Mood and Affect: Mood normal.     Comments: Pleasant            Assessment & Plan:   Problem List Items Addressed This Visit      Other   Smoking    Disc in detail risks of smoking and possible outcomes including copd, vascular/ heart disease, cancer , respiratory and sinus infections  Pt voices understanding Pt is not ready to quit unfortunately      Hyperlipidemia    Allergic to medications for high triglycerides  unfortunately  Not watching diet  Asked to check lipid panel today      Relevant Medications   furosemide (LASIX) 20 MG tablet   Other Relevant Orders   Lipid panel   Fatigue    Cbc, cmet today with labs      Relevant Orders   Comprehensive metabolic panel   CBC with Differential/Platelet   Jaw pain    Pain of area of angle of mandible- some swelling/usually in ams  Has a tooth s/p crown loss but no pain   Also TMJ-wears nite guard      Relevant Orders   Comprehensive metabolic panel   CBC with Differential/Platelet   Localized swelling, mass and lump, neck - Primary    Swollen/full area just below angle of mandible Per pt -earlier also behind and in front of ear (improves after being upright) Area corresponds to both LN and salivary glands  Disc dental f/u Adv sour food-to promote salivation in case of salivary gland obst  Px docycycline to cover poss infection Cbc ordered with labs If no imp would consider ENT ref-she will update [place]       Relevant Orders   Comprehensive metabolic panel   CBC with Differential/Platelet

## 2019-05-13 NOTE — Assessment & Plan Note (Signed)
Allergic to medications for high triglycerides unfortunately  Not watching diet  Asked to check lipid panel today

## 2019-05-16 ENCOUNTER — Other Ambulatory Visit: Payer: Self-pay

## 2019-05-16 ENCOUNTER — Encounter: Payer: Self-pay | Admitting: Family Medicine

## 2019-05-16 ENCOUNTER — Ambulatory Visit: Payer: BC Managed Care – PPO | Admitting: Family Medicine

## 2019-05-16 VITALS — BP 128/72 | HR 90 | Temp 96.9°F | Ht 68.0 in | Wt 243.3 lb

## 2019-05-16 DIAGNOSIS — E781 Pure hyperglyceridemia: Secondary | ICD-10-CM | POA: Diagnosis not present

## 2019-05-16 DIAGNOSIS — E119 Type 2 diabetes mellitus without complications: Secondary | ICD-10-CM | POA: Diagnosis not present

## 2019-05-16 DIAGNOSIS — E669 Obesity, unspecified: Secondary | ICD-10-CM | POA: Diagnosis not present

## 2019-05-16 DIAGNOSIS — R7309 Other abnormal glucose: Secondary | ICD-10-CM

## 2019-05-16 DIAGNOSIS — R7303 Prediabetes: Secondary | ICD-10-CM | POA: Insufficient documentation

## 2019-05-16 LAB — POCT GLYCOSYLATED HEMOGLOBIN (HGB A1C): Hemoglobin A1C: 9.7 % — AB (ref 4.0–5.6)

## 2019-05-16 MED ORDER — METFORMIN HCL 500 MG PO TABS: 500.0000 mg | ORAL_TABLET | Freq: Two times a day (BID) | ORAL | 3 refills | Status: DC

## 2019-05-16 NOTE — Patient Instructions (Addendum)
For diabetes control Try to get most of your carbohydrates from produce (with the exception of white potatoes)  Eat less bread/pasta/rice/snack foods/cereals/sweets and other items from the middle of the grocery store (processed carbs)   Start exercise 30 minutes a day -- work up to that on stationary bike   Weight loss greatly helps diabetes   I will refer you to a diabetes teaching classes at [place] office will call you in the next 1-2 weeks to set up diabetic teaching   We will discuss -  Medication to protect kidneys  Statin for cardiac prevention (maybe- if we can)   Call your pharmacy and find out what brand of glucose test equipment your insurance covers  Meter, strips, lancets  You or the pharmacy can call me and I will send it in  When you know how to use it- start checking glucose in am (fasting) and then 2 hours after latest meal of the day- keep a log

## 2019-05-16 NOTE — Assessment & Plan Note (Signed)
New diagnosis of DM2 in obese pt with family hx  No exercise currently  Diet was previously not optimal (though eats vegetables a lot)  Lab Results  Component Value Date   HGBA1C 9.7 (A) A999333   Disc implications of DM2 and possible outcomes re: end organ effects  Ref made for DM teaching at [place] to have pharmacy reach out re: brand of glucose test supplies she needs (offered inst here re: use but she has a family member to teach her) Optimally check glucose bid and prn  Start metformin 500 mg bid  Intol of statins (also triglyceride agents)  Will discuss ace at next visit  Recent eye exam - does not think dilated however  Rev low glycemic diet  Rev options for exercise  Wt loss is most important  F/u 3 mo or earlier if needed  Handouts on DM2 and care given

## 2019-05-16 NOTE — Assessment & Plan Note (Signed)
Now more important to tackle this due to new dm 2 Discussed how this problem influences overall health and the risks it imposes  Reviewed plan for weight loss with lower calorie diet (via better food choices and also portion control or program like weight watchers) and exercise building up to or more than 30 minutes 5 days per week including some aerobic activity

## 2019-05-16 NOTE — Progress Notes (Signed)
Subjective:    Patient ID: [name], female    DOB: 1960-02-03, 59 y.o.   MRN: [id number]   This visit occurred during the SARS-CoV-2 public health emergency.  Safety protocols were in place, including screening questions prior to the visit, additional usage of staff PPE, and extensive cleaning of exam room while observing appropriate contact time as indicated for disinfecting solutions.    HPI  Pt presents for f/u of lab work done with last visit on 4/20   Wt Readings from Last 3 Encounters:  05/16/19 243 lb 5 oz (110.4 kg)  05/13/19 243 lb 1 oz (110.3 kg)  05/16/17 242 lb (109.8 kg)   37.00 kg/m   At that time her glucose was 289  This is new for her   PGM- diabetes -type1 F- diabetes - type 2   Lab Results  Component Value Date   HGBA1C 9.7 (A) 05/16/2019    She has noticed a lot of thirst - drinks a lot of water  Does not have frequent urination   Has some numbness in toes - R foot only two toes   Vision has changed  More blurry  She went to the eye doctor and did get px glasses for computer/reading  Some near sightedness- mild   Lately her diet has consisted of box meals/pre cooked  She does eat a lot of salad as well  Does not like a lot of fruit  Not a lot of sweets - does not crave them   No exercise at all- too tired    BP Readings from Last 3 Encounters:  05/16/19 128/72  05/13/19 136/90  05/16/17 130/78   Pulse Readings from Last 3 Encounters:  05/16/19 90  05/13/19 94  05/16/17 95     Lab Results  Component Value Date   CREATININE 0.73 05/13/2019   BUN 12 05/13/2019   NA 134 (L) 05/13/2019   K 4.3 05/13/2019   CL 100 05/13/2019   CO2 27 05/13/2019   Lab Results  Component Value Date   ALT 37 (H) 05/13/2019   AST 19 05/13/2019   ALKPHOS 92 05/13/2019   BILITOT 0.3 05/13/2019   Lab Results  Component Value Date   WBC 9.4 05/13/2019   HGB 14.2 05/13/2019   HCT 41.4 05/13/2019   MCV 88.7 05/13/2019   PLT 280.0 05/13/2019     Lab Results  Component Value Date   TSH 2.96 02/10/2015     Cholesterol  Lab Results  Component Value Date   CHOL 259 (H) 05/13/2019   CHOL 273 (H) 04/30/2015   CHOL 288 (H) 02/10/2015   Lab Results  Component Value Date   HDL 27.00 (L) 05/13/2019   HDL 30.90 (L) 04/30/2015   HDL 30.80 (L) 02/10/2015   No results found for: [facility] Lab Results  Component Value Date   TRIG (H) 05/13/2019    1334.0 Triglyceride is over 400; calculations on Lipids are invalid.   TRIG (H) 04/30/2015    544.0 Triglyceride is over 400; calculations on Lipids are invalid.   TRIG (H) 02/10/2015    1072.0 Triglyceride is over 400; calculations on Lipids are invalid.   Lab Results  Component Value Date   CHOLHDL 10 05/13/2019   CHOLHDL 9 04/30/2015   CHOLHDL 9 02/10/2015   Lab Results  Component Value Date   LDLDIRECT 76.0 05/13/2019   LDLDIRECT 147.0 04/30/2015   LDLDIRECT 117.0 02/10/2015   Pt has been unable to tolerate any triglyceride  medications (allergic)  High glucose may add to this   Smoking status -no change  Has desire to quit    Patient Active Problem List   Diagnosis Date Noted  . Type 2 diabetes mellitus without complications ([place]) [id number]  . Jaw pain 05/13/2019  . Localized swelling, mass and lump, neck 05/13/2019  . GERD (gastroesophageal reflux disease) 05/16/2017  . Blisters of multiple sites 03/15/2016  . Obesity (BMI 30-39.9) 03/15/2016  . Hypertriglyceridemia 03/15/2016  . Leukocytosis 04/29/2015  . Fatigue 02/10/2015  . Menopausal symptoms 02/10/2015  . Knee pain, right 05/27/2013  . Colon cancer screening 04/01/2013  . Obesity, unspecified 07/23/2012  . Other malaise and fatigue 07/23/2012  . Weight gain 04/07/2011  . Adverse effects of medication 04/07/2011  . Pedal edema 04/07/2011  . Shortness of breath dyspnea 02/01/2011  . Chest tightness 02/01/2011  . Hyperlipidemia 12/02/2010  . Other screening mammogram 11/17/2010  . FIBROIDS, UTERUS  03/01/2007  . PERIMENOPAUSAL STATUS 02/22/2007  . ABFND, RADIOLOGICAL, BREAST NEC 08/21/2006  . Smoking 08/15/2006  . DEPRESSION 06/15/2006   Past Medical History:  Diagnosis Date  . Depressive disorder, not elsewhere classified   . Elevated blood pressure reading without diagnosis of hypertension   . Leiomyoma of uterus, unspecified   . Other (abnormal) findings on radiological examination of breast   . Pure hypercholesterolemia   . Tobacco use disorder    Past Surgical History:  Procedure Laterality Date  . BLADDER SUSPENSION    . BUNIONECTOMY    . PARTIAL HYSTERECTOMY     one ovary remains  . VAGINAL HYSTERECTOMY  2008   also had bladder tacked   Social History   Tobacco Use  . Smoking status: Current Every Day Smoker    Packs/day: 0.25    Types: Cigarettes  . Smokeless tobacco: Never Used  Substance Use Topics  . Alcohol use: No    Alcohol/week: 0.0 standard drinks    Comment: once a month  . Drug use: No   Family History  Problem Relation Age of Onset  . Cancer Mother        lung cancer  . COPD Father   . Diabetes Father   . Breast cancer Maternal Grandmother   . Diabetes type I Paternal Grandmother    Allergies  Allergen Reactions  . Midol [Aspirin-Cinnamedrine-Caffeine] Shortness Of Breath  . Acetaminophen     REACTION: dizzy  . Bupropion Hcl     REACTION: made worse  . Hydrocodone     n/v  . Ibuprofen   . Lopid [Gemfibrozil]     Caused blisters   . Niaspan [Niacin Er]     Diarrhea   . Penicillins     REACTION: dizzy   Current Outpatient Medications on File Prior to Visit  Medication Sig Dispense Refill  . albuterol (PROVENTIL HFA;VENTOLIN HFA) 108 (90 Base) MCG/ACT inhaler 2 puffs before exercise or as needed when short of breath 1 Inhaler 2  . doxycycline (VIBRA-TABS) 100 MG tablet Take 1 tablet (100 mg total) by mouth 2 (two) times daily. 14 tablet 0  . furosemide (LASIX) 20 MG tablet TAKE 1 TABLET BY MOUTH TWICE A DAY IF NEEDED 60 tablet  5  . [DISCONTINUED] albuterol-ipratropium (COMBIVENT) 18-103 MCG/ACT inhaler Inhale 2 puffs into the lungs every 6 (six) hours as needed for wheezing. 1 Inhaler 6  . [DISCONTINUED] budesonide-formoterol (SYMBICORT) 80-4.5 MCG/ACT inhaler Inhale 2 puffs into the lungs 2 (two) times daily. 1 Inhaler 11   No current facility-administered medications  on file prior to visit.    Review of Systems  Constitutional: Negative for activity change, appetite change, fatigue, fever and unexpected weight change.  HENT: Negative for congestion, ear pain, rhinorrhea, sinus pressure and sore throat.   Eyes: Negative for pain, redness and visual disturbance.  Respiratory: Negative for cough, shortness of breath and wheezing.   Cardiovascular: Negative for chest pain and palpitations.  Gastrointestinal: Negative for abdominal pain, blood in stool, constipation and diarrhea.  Endocrine: Positive for polydipsia. Negative for polyuria.  Genitourinary: Negative for dysuria, frequency and urgency.  Musculoskeletal: Positive for back pain. Negative for arthralgias and myalgias.  Skin: Negative for pallor and rash.  Allergic/Immunologic: Negative for environmental allergies.  Neurological: Negative for dizziness, syncope and headaches.  Hematological: Negative for adenopathy. Does not bruise/bleed easily.  Psychiatric/Behavioral: Negative for decreased concentration and dysphoric mood. The patient is not nervous/anxious.        Objective:   Physical Exam Constitutional:      Appearance: Normal appearance. She is obese. She is not ill-appearing.  Eyes:     General:        Right eye: No discharge.        Left eye: No discharge.     Extraocular Movements: Extraocular movements intact.     Conjunctiva/sclera: Conjunctivae normal.     Pupils: Pupils are equal, round, and reactive to light.  Cardiovascular:     Rate and Rhythm: Normal rate and regular rhythm.     Pulses: Normal pulses.  Pulmonary:     Effort:  Pulmonary effort is normal. No respiratory distress.     Breath sounds: Normal breath sounds. No wheezing.  Musculoskeletal:     Right lower leg: No edema.     Left lower leg: No edema.  Skin:    General: Skin is warm and dry.     Findings: No rash.  Neurological:     Mental Status: She is alert.     Sensory: No sensory deficit.  Psychiatric:        Mood and Affect: Mood normal.           Assessment & Plan:   Problem List Items Addressed This Visit      Endocrine   Type 2 diabetes mellitus without complications ([place]) - Primary    New diagnosis of DM2 in obese pt with family hx  No exercise currently  Diet was previously not optimal (though eats vegetables a lot)  Lab Results  Component Value Date   HGBA1C 9.7 (A) [id number]   Disc implications of DM2 and possible outcomes re: end organ effects  Ref made for DM teaching at [place] to have pharmacy reach out re: brand of glucose test supplies she needs (offered inst here re: use but she has a family member to teach her) Optimally check glucose bid and prn  Start metformin 500 mg bid  Intol of statins (also triglyceride agents)  Will discuss ace at next visit  Recent eye exam - does not think dilated however  Rev low glycemic diet  Rev options for exercise  Wt loss is most important  F/u 3 mo or earlier if needed  Handouts on DM2 and care given       Relevant Medications   metFORMIN (GLUCOPHAGE) 500 MG tablet   Other Relevant Orders   Ambulatory referral to diabetic education     Other   Obesity (BMI 30-39.9)    Now more important to tackle this due to new dm  2 Discussed how this problem influences overall health and the risks it imposes  Reviewed plan for weight loss with lower calorie diet (via better food choices and also portion control or program like weight watchers) and exercise building up to or more than 30 minutes 5 days per week including some aerobic activity         Relevant Medications    metFORMIN (GLUCOPHAGE) 500 MG tablet   Hypertriglyceridemia    Allergic to trig agents  Intol to all statins  Trig numbers up due to new diabetes Disc goals for lipids and reasons to control them Rev last labs with pt Rev low sat fat diet in detail

## 2019-05-16 NOTE — Assessment & Plan Note (Signed)
Allergic to trig agents  Intol to all statins  Trig numbers up due to new diabetes Disc goals for lipids and reasons to control them Rev last labs with pt Rev low sat fat diet in detail

## 2019-08-13 ENCOUNTER — Ambulatory Visit: Payer: BC Managed Care – PPO | Admitting: Dermatology

## 2019-08-13 ENCOUNTER — Other Ambulatory Visit: Payer: Self-pay

## 2019-08-13 DIAGNOSIS — D485 Neoplasm of uncertain behavior of skin: Secondary | ICD-10-CM | POA: Diagnosis not present

## 2019-08-13 DIAGNOSIS — D1801 Hemangioma of skin and subcutaneous tissue: Secondary | ICD-10-CM

## 2019-08-13 DIAGNOSIS — C44529 Squamous cell carcinoma of skin of other part of trunk: Secondary | ICD-10-CM

## 2019-08-13 DIAGNOSIS — L570 Actinic keratosis: Secondary | ICD-10-CM | POA: Diagnosis not present

## 2019-08-13 DIAGNOSIS — L820 Inflamed seborrheic keratosis: Secondary | ICD-10-CM | POA: Diagnosis not present

## 2019-08-13 DIAGNOSIS — L821 Other seborrheic keratosis: Secondary | ICD-10-CM | POA: Diagnosis not present

## 2019-08-13 NOTE — Progress Notes (Addendum)
   New Patient Visit  Subjective  [name] is a 59 y.o. female who presents for the following: Skin Problem.  Patient here today for a lesion on her back. She noticed it about 4 weeks ago and she thought it was a tick. It has grown and is very sore. She has not noticed any bleeding. No history of skin cancer.  She also has an irritated spot on her R jaw that she tends to pick.  The following portions of the chart were reviewed this encounter and updated as appropriate:      Review of Systems:  No other skin or systemic complaints except as noted in HPI or Assessment and Plan.  Objective  Well appearing patient in no apparent distress; mood and affect are within normal limits.  A focused examination was performed including back, face. Relevant physical exam findings are noted in the Assessment and Plan.  Objective  Left Upper Back: 49mm pink scaly papule     Objective  Right Mid Jaw: Erythematous keratotic waxy stuck-on papule    Assessment & Plan  Neoplasm of uncertain behavior of skin Left Upper Back  Epidermal / dermal shaving  Lesion diameter (cm):  0.7 Informed consent: discussed and consent obtained   Patient was prepped and draped in usual sterile fashion: Area prepped with alcohol. Anesthesia: the lesion was anesthetized in a standard fashion   Anesthetic:  1% lidocaine w/ epinephrine 1-100,000 buffered w/ 8.4% NaHCO3 Instrument used: flexible razor blade   Hemostasis achieved with: pressure, aluminum chloride and electrodesiccation   Outcome: patient tolerated procedure well    Destruction of lesion  Destruction method: electrodesiccation and curettage   Informed consent: discussed and consent obtained   Timeout:  patient name, date of birth, surgical site, and procedure verified Curettage performed in three different directions: Yes   Electrodesiccation performed over the curetted area: Yes   Lesion length (cm):  0.7 Lesion width (cm):  0.7 Margin  per side (cm):  0.1 Final wound size (cm):  0.9 Hemostasis achieved with:  pressure, aluminum chloride and electrodesiccation Outcome: patient tolerated procedure well with no complications   Post-procedure details: wound care instructions given    Specimen 1 - Surgical pathology Differential Diagnosis: Irritated Wart r/o SCC Check Margins: No 33mm pink scaly papule  Inflamed seborrheic keratosis Right Mid Jaw  Destruction of lesion - Right Mid Jaw  Destruction method: cryotherapy   Informed consent: discussed and consent obtained   Lesion destroyed using liquid nitrogen: Yes   Region frozen until ice ball extended beyond lesion: Yes   Outcome: patient tolerated procedure well with no complications   Post-procedure details: wound care instructions given    SCC (squamous cell carcinoma), trunk  Seborrheic Keratoses - Stuck-on, waxy, tan-brown papules and plaques  - Discussed benign etiology and prognosis. - Observe - Call for any changes  Hemangiomas - Red papules - Discussed benign nature - Observe - Call for any changes  Return if symptoms worsen or fail to improve.  Graciella Belton, RMA, am acting as scribe for Brendolyn Patty, MD . Documentation: I have reviewed the above documentation for accuracy and completeness, and I agree with the above.  Brendolyn Patty MD

## 2019-08-13 NOTE — Patient Instructions (Addendum)
Wound Care Instructions  1. Cleanse wound gently with soap and water once a day then pat dry with clean gauze. Apply a thing coat of Petrolatum (petroleum jelly, "Vaseline") over the wound (unless you have an allergy to this). We recommend that you use a new, sterile tube of Vaseline. Do not pick or remove scabs. Do not remove the yellow or white "healing tissue" from the base of the wound.  2. Cover the wound with fresh, clean, nonstick gauze and secure with paper tape. You may use Band-Aids in place of gauze and tape if the would is small enough, but would recommend trimming much of the tape off as there is often too much. Sometimes Band-Aids can irritate the skin.  3. You should call the office for your biopsy report after 1 week if you have not already been contacted.  4. If you experience any problems, such as abnormal amounts of bleeding, swelling, significant bruising, significant pain, or evidence of infection, please call the office immediately.  5. FOR ADULT SURGERY PATIENTS: If you need something for pain relief you may take 1 extra strength Tylenol (acetaminophen) AND 2 Ibuprofen (200mg  each) together every 4 hours as needed for pain. (do not take these if you are allergic to them or if you have a reason you should not take them.) Typically, you may only need pain medication for 1 to 3 days.  Cryotherapy Aftercare  . Wash gently with soap and water everyday.   Marland Kitchen Apply Vaseline and Band-Aid daily until healed.  Recommend daily broad spectrum sunscreen SPF 30+ to sun-exposed areas, reapply every 2 hours as needed. Call for new or changing lesions.

## 2019-08-15 ENCOUNTER — Ambulatory Visit: Payer: BC Managed Care – PPO | Admitting: Family Medicine

## 2019-08-15 ENCOUNTER — Other Ambulatory Visit: Payer: Self-pay

## 2019-08-15 ENCOUNTER — Encounter: Payer: Self-pay | Admitting: Family Medicine

## 2019-08-15 VITALS — BP 126/80 | HR 80 | Temp 96.9°F | Ht 68.0 in | Wt 230.2 lb

## 2019-08-15 DIAGNOSIS — F17200 Nicotine dependence, unspecified, uncomplicated: Secondary | ICD-10-CM | POA: Diagnosis not present

## 2019-08-15 DIAGNOSIS — E1169 Type 2 diabetes mellitus with other specified complication: Secondary | ICD-10-CM

## 2019-08-15 DIAGNOSIS — E119 Type 2 diabetes mellitus without complications: Secondary | ICD-10-CM | POA: Diagnosis not present

## 2019-08-15 DIAGNOSIS — Z6835 Body mass index (BMI) 35.0-35.9, adult: Secondary | ICD-10-CM

## 2019-08-15 DIAGNOSIS — E785 Hyperlipidemia, unspecified: Secondary | ICD-10-CM

## 2019-08-15 DIAGNOSIS — E781 Pure hyperglyceridemia: Secondary | ICD-10-CM

## 2019-08-15 LAB — POCT GLYCOSYLATED HEMOGLOBIN (HGB A1C): Hemoglobin A1C: 7.4 % — AB (ref 4.0–5.6)

## 2019-08-15 MED ORDER — METFORMIN HCL 1000 MG PO TABS: 1000.0000 mg | ORAL_TABLET | Freq: Two times a day (BID) | ORAL | 5 refills | Status: DC

## 2019-08-15 NOTE — Patient Instructions (Addendum)
Continue working on the diabetic diet and weight loss   Increase metformin to 1000 mg twice daily   Avoid late eating due to high glucose in morning   Check sugar - am fasting  , 2 hours after a meal  Switch days   Aim for at least 30 minutes of good exercise per day -in or outdoors  Keep cutting the smoking -good job so far   A1C is down from 9.7 to 7.4  Excellent improvement !

## 2019-08-15 NOTE — Progress Notes (Signed)
Subjective:    Patient ID: [name], female    DOB: 07-Sep-1960, 59 y.o.   MRN: [id number]  This visit occurred during the SARS-CoV-2 public health emergency.  Safety protocols were in place, including screening questions prior to the visit, additional usage of staff PPE, and extensive cleaning of exam room while observing appropriate contact time as indicated for disinfecting solutions.    HPI Pt presents for f/u of chronic conditions /diabetes  Wt Readings from Last 3 Encounters:  08/15/19 (!) 230 lb 3 oz (104.4 kg)  05/16/19 243 lb 5 oz (110.4 kg)  05/13/19 243 lb 1 oz (110.3 kg)   35.00 kg/m  Eating better  Outdoor work for exercise   Wt is coming down   Lost her job right after last visit  Did find a new job - getting a little easier    DM2 not controlled  Lab Results  Component Value Date   HGBA1C 9.7 (A) 05/16/2019   She was newly diagnosed at that time We ref to DM teaching -could not go due to loss of job temporarily  Has had a nurse friend teach her about diabetes and lifestyle  Started metformin 500 mg bid Disc glucose monitoring and discussed DM diet and goals of wt loss and smoking cessation  Noted intol of statins and triglyceride reducers   She quit sweet tea and potato and rice  Not as much bread  Uses some artificial sweetener as well   She is getting used to checking glucose  Usually 220  Getting a little lower -190s  Checks it mid day  Higher in am but pms are better    Today Lab Results  Component Value Date   HGBA1C 7.4 (A) 08/15/2019     BP Readings from Last 3 Encounters:  08/15/19 126/80  05/16/19 128/72  05/13/19 136/90   Lab Results  Component Value Date   CREATININE 0.73 05/13/2019   BUN 12 05/13/2019   NA 134 (L) 05/13/2019   K 4.3 05/13/2019   CL 100 05/13/2019   CO2 27 05/13/2019    Pulse Readings from Last 3 Encounters:  08/15/19 80  05/16/19 90  05/13/19 94   Hyperlipidemia  Lab Results  Component  Value Date   CHOL 259 (H) 05/13/2019   HDL 27.00 (L) 05/13/2019   LDLDIRECT 76.0 05/13/2019   TRIG (H) 05/13/2019    1334.0 Triglyceride is over 400; calculations on Lipids are invalid.   CHOLHDL 10 05/13/2019   Down to 4 cig per day   Patient Active Problem List   Diagnosis Date Noted   Type 2 diabetes mellitus without complications ([place]) 43/32/9518   Jaw pain 05/13/2019   Localized swelling, mass and lump, neck 05/13/2019   GERD (gastroesophageal reflux disease) 05/16/2017   Blisters of multiple sites 03/15/2016   Body mass index (BMI) of 35.0 to 35.9 in adult 03/15/2016   Hypertriglyceridemia 03/15/2016   Leukocytosis 04/29/2015   Fatigue 02/10/2015   Menopausal symptoms 02/10/2015   Knee pain, right 05/27/2013   Colon cancer screening 04/01/2013   Obesity, unspecified 07/23/2012   Other malaise and fatigue 07/23/2012   Adverse effects of medication 04/07/2011   Pedal edema 04/07/2011   Shortness of breath dyspnea 02/01/2011   Hyperlipidemia associated with type 2 diabetes mellitus ([place]) 12/02/2010   Other screening mammogram 11/17/2010   FIBROIDS, UTERUS 03/01/2007   PERIMENOPAUSAL STATUS 02/22/2007   ABFND, RADIOLOGICAL, BREAST NEC 08/21/2006   Smoking 08/15/2006   DEPRESSION 06/15/2006  Past Medical History:  Diagnosis Date   Depressive disorder, not elsewhere classified    Elevated blood pressure reading without diagnosis of hypertension    Leiomyoma of uterus, unspecified    Other (abnormal) findings on radiological examination of breast    Pure hypercholesterolemia    Tobacco use disorder    Past Surgical History:  Procedure Laterality Date   BLADDER SUSPENSION     BUNIONECTOMY     PARTIAL HYSTERECTOMY     one ovary remains   VAGINAL HYSTERECTOMY  2008   also had bladder tacked   Social History   Tobacco Use   Smoking status: Light Tobacco Smoker    Packs/day: 0.25    Types: Cigarettes   Smokeless tobacco:  Never Used  Substance Use Topics   Alcohol use: No    Alcohol/week: 0.0 standard drinks    Comment: once a month   Drug use: No   Family History  Problem Relation Age of Onset   Cancer Mother        lung cancer   COPD Father    Diabetes Father    Breast cancer Maternal Grandmother    Diabetes type I Paternal Grandmother    Allergies  Allergen Reactions   Midol [Aspirin-Cinnamedrine-Caffeine] Shortness Of Breath   Acetaminophen     REACTION: dizzy   Bupropion Hcl     REACTION: made worse   Hydrocodone     n/v   Ibuprofen    Lopid [Gemfibrozil]     Caused blisters    Niaspan [Niacin Er]     Diarrhea    Penicillins     REACTION: dizzy   Current Outpatient Medications on File Prior to Visit  Medication Sig Dispense Refill   albuterol (PROVENTIL HFA;VENTOLIN HFA) 108 (90 Base) MCG/ACT inhaler 2 puffs before exercise or as needed when short of breath 1 Inhaler 2   furosemide (LASIX) 20 MG tablet TAKE 1 TABLET BY MOUTH TWICE A DAY IF NEEDED 60 tablet 5   [DISCONTINUED] albuterol-ipratropium (COMBIVENT) 18-103 MCG/ACT inhaler Inhale 2 puffs into the lungs every 6 (six) hours as needed for wheezing. 1 Inhaler 6   [DISCONTINUED] budesonide-formoterol (SYMBICORT) 80-4.5 MCG/ACT inhaler Inhale 2 puffs into the lungs 2 (two) times daily. 1 Inhaler 11   No current facility-administered medications on file prior to visit.     Review of Systems  Constitutional: Positive for fatigue. Negative for activity change, appetite change, fever and unexpected weight change.  HENT: Negative for congestion, ear pain, rhinorrhea, sinus pressure and sore throat.   Eyes: Negative for pain, redness and visual disturbance.  Respiratory: Negative for cough, shortness of breath and wheezing.   Cardiovascular: Negative for chest pain and palpitations.  Gastrointestinal: Negative for abdominal pain, blood in stool, constipation and diarrhea.  Endocrine: Negative for polydipsia and  polyuria.  Genitourinary: Negative for dysuria, frequency and urgency.  Musculoskeletal: Negative for arthralgias, back pain and myalgias.  Skin: Negative for pallor and rash.  Allergic/Immunologic: Negative for environmental allergies.  Neurological: Negative for dizziness, syncope and headaches.  Hematological: Negative for adenopathy. Does not bruise/bleed easily.  Psychiatric/Behavioral: Negative for decreased concentration and dysphoric mood. The patient is not nervous/anxious.        Objective:   Physical Exam Constitutional:      General: She is not in acute distress.    Appearance: Normal appearance. She is well-developed. She is obese. She is not ill-appearing or diaphoretic.  HENT:     Head: Normocephalic and atraumatic.  Eyes:  Conjunctiva/sclera: Conjunctivae normal.     Pupils: Pupils are equal, round, and reactive to light.  Neck:     Thyroid: No thyromegaly.     Vascular: No carotid bruit or JVD.  Cardiovascular:     Rate and Rhythm: Normal rate and regular rhythm.     Heart sounds: Normal heart sounds. No gallop.   Pulmonary:     Effort: Pulmonary effort is normal. No respiratory distress.     Breath sounds: Normal breath sounds. No wheezing or rales.     Comments: Diffusely distant bs  Abdominal:     General: Bowel sounds are normal. There is no distension or abdominal bruit.     Palpations: Abdomen is soft. There is no mass.     Tenderness: There is no abdominal tenderness.  Musculoskeletal:     Cervical back: Normal range of motion and neck supple.     Right lower leg: No edema.     Left lower leg: No edema.  Lymphadenopathy:     Cervical: No cervical adenopathy.  Skin:    General: Skin is warm and dry.     Coloration: Skin is not pale.     Findings: No erythema or rash.  Neurological:     Mental Status: She is alert.     Sensory: No sensory deficit.     Coordination: Coordination normal.     Deep Tendon Reflexes: Reflexes are normal and  symmetric. Reflexes normal.  Psychiatric:        Mood and Affect: Mood normal.           Assessment & Plan:   Problem List Items Addressed This Visit      Endocrine   Hyperlipidemia associated with type 2 diabetes mellitus ([place])    Pt has h/o very high triglycerides and is allergic/intol to all meds so far  Have consulted lipid clinic Also does not resp to niaspan  Will check in 3 mo and see if better sugar control and diet/wt loss makes a difference      Relevant Medications   metFORMIN (GLUCOPHAGE) 1000 MG tablet   Other Relevant Orders   Basic metabolic panel   Type 2 diabetes mellitus without complications ([place]) - Primary    Lab Results  Component Value Date   HGBA1C 7.4 (A) 08/15/2019   Much improvement with wt loss/dm diet/exercise and metformin 500  Rev glucose levels (peak in am)  Inc metformin to 1000 mg bid as tolerated  Cannot do dm teaching yet but has had inst from a friend Commended Also cutting back smoking F/u 3 mo with lab prior  Will incl lipid and microalb      Relevant Medications   metFORMIN (GLUCOPHAGE) 1000 MG tablet   Other Relevant Orders   POCT glycosylated hemoglobin (Hb A1C) (Completed)   Basic metabolic panel   Hemoglobin A1c   Microalbumin / creatinine urine ratio     Other   Smoking    Pt has cut down to 4 cig per day  Doing well  Unsure if ready to quit but plans to continue cutting back as she can      Body mass index (BMI) of 35.0 to 35.9 in adult   Hypertriglyceridemia   Relevant Orders   Lipid panel

## 2019-08-15 NOTE — Assessment & Plan Note (Signed)
Pt has h/o very high triglycerides and is allergic/intol to all meds so far  Have consulted lipid clinic Also does not resp to niaspan  Will check in 3 mo and see if better sugar control and diet/wt loss makes a difference

## 2019-08-15 NOTE — Assessment & Plan Note (Signed)
Lab Results  Component Value Date   HGBA1C 7.4 (A) 08/15/2019   Much improvement with wt loss/dm diet/exercise and metformin 500  Rev glucose levels (peak in am)  Inc metformin to 1000 mg bid as tolerated  Cannot do dm teaching yet but has had inst from a friend Commended Also cutting back smoking F/u 3 mo with lab prior  Will incl lipid and microalb

## 2019-08-15 NOTE — Assessment & Plan Note (Signed)
Pt has cut down to 4 cig per day  Doing well  Unsure if ready to quit but plans to continue cutting back as she can

## 2019-08-25 ENCOUNTER — Telehealth: Payer: Self-pay

## 2019-08-25 NOTE — Telephone Encounter (Signed)
-----   Message from Brendolyn Patty, MD sent at 08/21/2019  5:13 PM EDT ----- Skin , left upper back HYPERPLASTIC ACTINIC KERATOSIS WITH HPV RELATED CHANGES  Precancer with wart like changes- already treated with EDC at time of biopsy

## 2019-08-25 NOTE — Telephone Encounter (Signed)
Advised pt of bx results/sh ?

## 2019-08-27 ENCOUNTER — Other Ambulatory Visit: Payer: Self-pay | Admitting: Family Medicine

## 2019-08-27 DIAGNOSIS — Z1231 Encounter for screening mammogram for malignant neoplasm of breast: Secondary | ICD-10-CM

## 2019-09-15 ENCOUNTER — Ambulatory Visit
Admission: RE | Admit: 2019-09-15 | Discharge: 2019-09-15 | Disposition: A | Discharge status: Home / Self Care | Payer: BC Managed Care – PPO | Source: Ambulatory Visit | Attending: Family Medicine | Admitting: Family Medicine

## 2019-09-15 ENCOUNTER — Other Ambulatory Visit: Payer: Self-pay

## 2019-09-15 DIAGNOSIS — Z1231 Encounter for screening mammogram for malignant neoplasm of breast: Secondary | ICD-10-CM | POA: Insufficient documentation

## 2019-09-17 ENCOUNTER — Other Ambulatory Visit: Payer: Self-pay | Admitting: Family Medicine

## 2019-09-17 DIAGNOSIS — N6320 Unspecified lump in the left breast, unspecified quadrant: Secondary | ICD-10-CM

## 2019-09-17 DIAGNOSIS — N6459 Other signs and symptoms in breast: Secondary | ICD-10-CM

## 2019-09-18 ENCOUNTER — Other Ambulatory Visit: Payer: Self-pay | Admitting: Family Medicine

## 2019-09-23 ENCOUNTER — Other Ambulatory Visit: Payer: Self-pay

## 2019-09-23 ENCOUNTER — Ambulatory Visit
Admission: RE | Admit: 2019-09-23 | Discharge: 2019-09-23 | Disposition: A | Discharge status: Home / Self Care | Payer: BC Managed Care – PPO | Source: Ambulatory Visit | Attending: Family Medicine | Admitting: Family Medicine

## 2019-09-23 DIAGNOSIS — N6320 Unspecified lump in the left breast, unspecified quadrant: Secondary | ICD-10-CM

## 2019-09-23 DIAGNOSIS — R928 Other abnormal and inconclusive findings on diagnostic imaging of breast: Secondary | ICD-10-CM | POA: Diagnosis not present

## 2019-09-23 DIAGNOSIS — N6459 Other signs and symptoms in breast: Secondary | ICD-10-CM

## 2019-11-07 ENCOUNTER — Other Ambulatory Visit: Payer: BC Managed Care – PPO

## 2019-11-14 ENCOUNTER — Ambulatory Visit: Payer: BC Managed Care – PPO | Admitting: Family Medicine

## 2019-12-11 ENCOUNTER — Other Ambulatory Visit: Payer: BC Managed Care – PPO

## 2019-12-12 ENCOUNTER — Other Ambulatory Visit: Payer: Self-pay

## 2019-12-12 ENCOUNTER — Other Ambulatory Visit (INDEPENDENT_AMBULATORY_CARE_PROVIDER_SITE_OTHER): Payer: BC Managed Care – PPO

## 2019-12-12 DIAGNOSIS — E785 Hyperlipidemia, unspecified: Secondary | ICD-10-CM | POA: Diagnosis not present

## 2019-12-12 DIAGNOSIS — E119 Type 2 diabetes mellitus without complications: Secondary | ICD-10-CM | POA: Diagnosis not present

## 2019-12-12 DIAGNOSIS — E781 Pure hyperglyceridemia: Secondary | ICD-10-CM | POA: Diagnosis not present

## 2019-12-12 DIAGNOSIS — E1169 Type 2 diabetes mellitus with other specified complication: Secondary | ICD-10-CM

## 2019-12-12 LAB — BASIC METABOLIC PANEL
BUN: 14 mg/dL (ref 6–23)
CO2: 25 mEq/L (ref 19–32)
Calcium: 9 mg/dL (ref 8.4–10.5)
Chloride: 106 mEq/L (ref 96–112)
Creatinine, Ser: 0.71 mg/dL (ref 0.40–1.20)
GFR: 93.73 mL/min (ref >60.00)
Glucose, Bld: 120 mg/dL — ABNORMAL HIGH (ref 70–99)
Potassium: 4.4 mEq/L (ref 3.5–5.1)
Sodium: 139 mEq/L (ref 135–145)

## 2019-12-12 LAB — LIPID PANEL
Cholesterol: 243 mg/dL — ABNORMAL HIGH (ref 0–200)
HDL: 30.3 mg/dL — ABNORMAL LOW (ref >39.00)
NonHDL: 212.68
Total CHOL/HDL Ratio: 8
Triglycerides: 251 mg/dL — ABNORMAL HIGH (ref 0.0–149.0)
VLDL: 50.2 mg/dL — ABNORMAL HIGH (ref 0.0–40.0)

## 2019-12-12 LAB — LDL CHOLESTEROL, DIRECT: Direct LDL: 177 mg/dL

## 2019-12-12 LAB — HEMOGLOBIN A1C: Hgb A1c MFr Bld: 6.3 % (ref 4.6–6.5)

## 2019-12-15 ENCOUNTER — Other Ambulatory Visit: Payer: BC Managed Care – PPO

## 2019-12-15 DIAGNOSIS — E119 Type 2 diabetes mellitus without complications: Secondary | ICD-10-CM | POA: Diagnosis not present

## 2019-12-16 LAB — MICROALBUMIN / CREATININE URINE RATIO
Creatinine,U: 149.8 mg/dL
Microalb Creat Ratio: 13.7 mg/g (ref 0.0–30.0)
Microalb, Ur: 20.6 mg/dL — ABNORMAL HIGH (ref 0.0–1.9)

## 2019-12-23 ENCOUNTER — Other Ambulatory Visit: Payer: Self-pay

## 2019-12-23 ENCOUNTER — Encounter: Payer: Self-pay | Admitting: Family Medicine

## 2019-12-23 ENCOUNTER — Ambulatory Visit (INDEPENDENT_AMBULATORY_CARE_PROVIDER_SITE_OTHER): Payer: BC Managed Care – PPO | Admitting: Family Medicine

## 2019-12-23 VITALS — BP 134/86 | HR 102 | Temp 97.0°F | Ht 66.5 in | Wt 216.1 lb

## 2019-12-23 DIAGNOSIS — E6609 Other obesity due to excess calories: Secondary | ICD-10-CM

## 2019-12-23 DIAGNOSIS — E1169 Type 2 diabetes mellitus with other specified complication: Secondary | ICD-10-CM | POA: Diagnosis not present

## 2019-12-23 DIAGNOSIS — E119 Type 2 diabetes mellitus without complications: Secondary | ICD-10-CM | POA: Diagnosis not present

## 2019-12-23 DIAGNOSIS — Z6834 Body mass index (BMI) 34.0-34.9, adult: Secondary | ICD-10-CM

## 2019-12-23 DIAGNOSIS — M67449 Ganglion, unspecified hand: Secondary | ICD-10-CM | POA: Insufficient documentation

## 2019-12-23 DIAGNOSIS — E781 Pure hyperglyceridemia: Secondary | ICD-10-CM

## 2019-12-23 DIAGNOSIS — F17200 Nicotine dependence, unspecified, uncomplicated: Secondary | ICD-10-CM

## 2019-12-23 DIAGNOSIS — E785 Hyperlipidemia, unspecified: Secondary | ICD-10-CM

## 2019-12-23 DIAGNOSIS — Z23 Encounter for immunization: Secondary | ICD-10-CM

## 2019-12-23 DIAGNOSIS — Z0000 Encounter for general adult medical examination without abnormal findings: Secondary | ICD-10-CM

## 2019-12-23 DIAGNOSIS — Z0001 Encounter for general adult medical examination with abnormal findings: Secondary | ICD-10-CM | POA: Insufficient documentation

## 2019-12-23 DIAGNOSIS — T148XXA Other injury of unspecified body region, initial encounter: Secondary | ICD-10-CM

## 2019-12-23 DIAGNOSIS — M79605 Pain in left leg: Secondary | ICD-10-CM | POA: Insufficient documentation

## 2019-12-23 MED ORDER — METFORMIN HCL 1000 MG PO TABS
1000.0000 mg | ORAL_TABLET | Freq: Two times a day (BID) | ORAL | 5 refills | Status: DC
Start: 2019-12-23 — End: 2020-02-06

## 2019-12-23 MED ORDER — LISINOPRIL 10 MG PO TABS: 5.0000 mg | ORAL_TABLET | Freq: Every day | ORAL | 3 refills | Status: DC

## 2019-12-23 MED ORDER — FUROSEMIDE 20 MG PO TABS
ORAL_TABLET | ORAL | 11 refills | Status: DC
Start: 2019-12-23 — End: 2020-11-29

## 2019-12-23 NOTE — Assessment & Plan Note (Signed)
Discomfort and numbness in L outer thigh Disc possible meralgia paresthetica  Will try ibuprofen instead of BC (and take with food)  inst to update if no improvement Wt loss may also help

## 2019-12-23 NOTE — Assessment & Plan Note (Addendum)
Feet- medial great toes from her steel toed shoe Strongly enc to find better fitting shoes  Urged to consider podiatry referral to help as well  As a diabetic , inc risk of infection/pt is aware

## 2019-12-23 NOTE — Assessment & Plan Note (Signed)
Discussed how this problem influences overall health and the risks it imposes  Reviewed plan for weight loss with lower calorie diet (via better food choices and also portion control or program like weight watchers) and exercise building up to or more than 30 minutes 5 days per week including some aerobic activity   Commended the great work so far

## 2019-12-23 NOTE — Assessment & Plan Note (Signed)
Lab Results  Component Value Date   HGBA1C 6.3 12/12/2019   Much improvement with better diet/exercise and wt loss  Eye exam planned for January  Plan to continue metformin 1000 mg bid (she sometimes takes less) Noted elevated microalb- will start lisinopril 5 mg for renal protection  Enc her to keep up the good work

## 2019-12-23 NOTE — Assessment & Plan Note (Signed)
H/o  hypertriglyceridemia  Trig are down to 251 with better diet  Intol of all cholesterol medicines tried  Disc goals for lipids and reasons to control them Rev last labs with pt Rev low sat fat diet in detail

## 2019-12-23 NOTE — Patient Instructions (Addendum)
If you are interested in the shingles vaccine series (Shingrix), call your insurance or pharmacy to check on coverage and location it must be given.  If affordable - you can schedule it here or at your pharmacy depending on coverage   If you want to see a podiatrist in the future for blisters please call and let us know   Labs look much better! Keep up the good work   Start lisinopril 5 mg daily for kidney protection   Follow up in about 6 months

## 2019-12-23 NOTE — Assessment & Plan Note (Signed)
Warned not to poke or open area  Consider hand specialist referral if this does not resolve

## 2019-12-23 NOTE — Assessment & Plan Note (Signed)
Disc in detail risks of smoking and possible outcomes including copd, vascular/ heart disease, cancer , respiratory and sinus infections  Pt voices understanding Pt is not ready to quit now unfortunately

## 2019-12-23 NOTE — Progress Notes (Signed)
Subjective:    Patient ID: [name], female    DOB: 1960/11/04, 59 y.o.   MRN: [id number]  This visit occurred during the SARS-CoV-2 public health emergency.  Safety protocols were in place, including screening questions prior to the visit, additional usage of staff PPE, and extensive cleaning of exam room while observing appropriate contact time as indicated for disinfecting solutions.    HPI Here for health maintenance exam and to review chronic medical problems    Wt Readings from Last 3 Encounters:  12/23/19 216 lb 2 oz (98 kg)  08/15/19 (!) 230 lb 3 oz (104.4 kg)  05/16/19 243 lb 5 oz (110.4 kg)   34.36 kg/m  Eating differently  Avoiding sugar almost entirely  No sweet tea or bread  No longer had headaches with less sugar    Flu shot -will get today  covid status - has not had covid and  Declines vaccine   Zoster status - not interested in shingrix yet- perhaps  Tdap 11/12 pna 23 11/20   Smoking - light  About 5 per day  Wants to quit but not ready  No breathing trouble   Had a partial hysterectomy   Mammogram 8/21-addnl views / neg  Self breast exam - no lumps   Colonoscopy 6/15   Neg Hep C and HIV screen in 2016  BP Readings from Last 3 Encounters:  12/23/19 134/86  08/15/19 126/80  05/16/19 128/72   Pulse Readings from Last 3 Encounters:  12/23/19 (!) 102  08/15/19 80  05/16/19 90  was rushing to get here   H/o high triglycerides  Lab Results  Component Value Date   CHOL 243 (H) 12/12/2019   CHOL 259 (H) 05/13/2019   CHOL 273 (H) 04/30/2015   Lab Results  Component Value Date   HDL 30.30 (L) 12/12/2019   HDL 27.00 (L) 05/13/2019   HDL 30.90 (L) 04/30/2015   No results found for: [facility] Lab Results  Component Value Date   TRIG 251.0 (H) 12/12/2019   TRIG (H) 05/13/2019    1334.0 Triglyceride is over 400; calculations on Lipids are invalid.   TRIG (H) 04/30/2015    544.0 Triglyceride is over 400; calculations on Lipids are  invalid.   Lab Results  Component Value Date   CHOLHDL 8 12/12/2019   CHOLHDL 10 05/13/2019   CHOLHDL 9 04/30/2015   Lab Results  Component Value Date   LDLDIRECT 177.0 12/12/2019   LDLDIRECT 76.0 05/13/2019   LDLDIRECT 147.0 04/30/2015   Allergic to fibrates and niaspan did not work  Controlling with diet   DM2 Lab Results  Component Value Date   HGBA1C 6.3 12/12/2019  taking metformin 1000 mg bid   Down from 7.4  Lab Results  Component Value Date   MICROALBUR 20.6 (H) 12/15/2019   Low glycemic diet  Doing much much better   Eye exam -she is due in January (will have insurance then)  Intol of statin    She takes BC powder for L leg pain and numbness Outside of L thigh  Has to wear steel toe shoes to work and gets blisters  Stands for work   Patient Active Problem List   Diagnosis Date Noted  . Routine general medical examination at a health care facility 12/23/2019  . Pain in left leg 12/23/2019  . Mucous cyst of finger 12/23/2019  . Type 2 diabetes mellitus without complications ([place]) 21/30/8657  . Jaw pain 05/13/2019  . Localized swelling, mass  and lump, neck 05/13/2019  . GERD (gastroesophageal reflux disease) 05/16/2017  . Blister 03/15/2016  . Class 1 obesity due to excess calories with serious comorbidity and body mass index (BMI) of 34.0 to 34.9 in adult 03/15/2016  . Hypertriglyceridemia 03/15/2016  . Leukocytosis 04/29/2015  . Fatigue 02/10/2015  . Menopausal symptoms 02/10/2015  . Knee pain, right 05/27/2013  . Colon cancer screening 04/01/2013  . Adverse effects of medication 04/07/2011  . Pedal edema 04/07/2011  . Shortness of breath dyspnea 02/01/2011  . Hyperlipidemia associated with type 2 diabetes mellitus ([place]) 12/02/2010  . Other screening mammogram 11/17/2010  . PERIMENOPAUSAL STATUS 02/22/2007  . ABFND, RADIOLOGICAL, BREAST NEC 08/21/2006  . Smoking 08/15/2006  . DEPRESSION 06/15/2006   Past Medical History:  Diagnosis Date  .  Depressive disorder, not elsewhere classified   . Elevated blood pressure reading without diagnosis of hypertension   . Leiomyoma of uterus, unspecified   . Other (abnormal) findings on radiological examination of breast   . Pure hypercholesterolemia   . Tobacco use disorder    Past Surgical History:  Procedure Laterality Date  . BLADDER SUSPENSION    . BUNIONECTOMY    . PARTIAL HYSTERECTOMY     one ovary remains  . VAGINAL HYSTERECTOMY  2008   also had bladder tacked   Social History   Tobacco Use  . Smoking status: Light Tobacco Smoker    Packs/day: 0.25    Types: Cigarettes  . Smokeless tobacco: Never Used  Substance Use Topics  . Alcohol use: No    Alcohol/week: 0.0 standard drinks    Comment: once a month  . Drug use: No   Family History  Problem Relation Age of Onset  . Cancer Mother        lung cancer  . COPD Father   . Diabetes Father   . Breast cancer Maternal Grandmother   . Diabetes type I Paternal Grandmother    Allergies  Allergen Reactions  . Midol [Aspirin-Cinnamedrine-Caffeine] Shortness Of Breath  . Acetaminophen     REACTION: dizzy  . Bupropion Hcl     REACTION: made worse  . Hydrocodone     n/v  . Ibuprofen   . Lopid [Gemfibrozil]     Caused blisters   . Niaspan [Niacin Er]     Diarrhea   . Penicillins     REACTION: dizzy   Current Outpatient Medications on File Prior to Visit  Medication Sig Dispense Refill  . albuterol (PROVENTIL HFA;VENTOLIN HFA) 108 (90 Base) MCG/ACT inhaler 2 puffs before exercise or as needed when short of breath 1 Inhaler 2   No current facility-administered medications on file prior to visit.     Review of Systems  Constitutional: Negative for activity change, appetite change, fatigue, fever and unexpected weight change.  HENT: Negative for congestion, ear pain, rhinorrhea, sinus pressure and sore throat.   Eyes: Negative for pain, redness and visual disturbance.  Respiratory: Negative for cough, shortness  of breath and wheezing.   Cardiovascular: Negative for chest pain and palpitations.  Gastrointestinal: Negative for abdominal pain, blood in stool, constipation and diarrhea.  Endocrine: Negative for polydipsia and polyuria.  Genitourinary: Negative for dysuria, frequency and urgency.  Musculoskeletal: Positive for arthralgias. Negative for back pain and myalgias.  Skin: Positive for wound. Negative for pallor and rash.  Allergic/Immunologic: Negative for environmental allergies.  Neurological: Positive for numbness. Negative for dizziness, syncope and headaches.  Hematological: Negative for adenopathy. Does not bruise/bleed easily.  Psychiatric/Behavioral: Negative for  decreased concentration and dysphoric mood. The patient is not nervous/anxious.        Objective:   Physical Exam Constitutional:      General: She is not in acute distress.    Appearance: Normal appearance. She is well-developed. She is obese. She is not ill-appearing or diaphoretic.  HENT:     Head: Normocephalic and atraumatic.     Right Ear: Tympanic membrane, ear canal and external ear normal.     Left Ear: Tympanic membrane, ear canal and external ear normal.     Nose: Nose normal. No congestion.     Mouth/Throat:     Mouth: Mucous membranes are moist.     Pharynx: Oropharynx is clear. No posterior oropharyngeal erythema.  Eyes:     General: No scleral icterus.    Extraocular Movements: Extraocular movements intact.     Conjunctiva/sclera: Conjunctivae normal.     Pupils: Pupils are equal, round, and reactive to light.  Neck:     Thyroid: No thyromegaly.     Vascular: No carotid bruit or JVD.  Cardiovascular:     Rate and Rhythm: Normal rate and regular rhythm.     Pulses: Normal pulses.     Heart sounds: Normal heart sounds. No gallop.   Pulmonary:     Effort: Pulmonary effort is normal. No respiratory distress.     Breath sounds: Normal breath sounds. No wheezing.     Comments: Good air exch Chest:      Chest wall: No tenderness.  Abdominal:     General: Bowel sounds are normal. There is no distension or abdominal bruit.     Palpations: Abdomen is soft. There is no mass.     Tenderness: There is no abdominal tenderness.     Hernia: No hernia is present.  Genitourinary:    Comments: Breast exam: No mass, nodules, thickening, tenderness, bulging, retraction, inflamation, nipple discharge or skin changes noted.  No axillary or clavicular LA.     Musculoskeletal:        General: No tenderness. Normal range of motion.     Cervical back: Normal range of motion and neck supple. No rigidity. No muscular tenderness.     Right lower leg: No edema.     Left lower leg: No edema.  Lymphadenopathy:     Cervical: No cervical adenopathy.  Skin:    General: Skin is warm and dry.     Coloration: Skin is not pale.     Findings: No erythema or rash.     Comments: Solar lentigines diffusely  Mucous cyst on distal R 3rd finger-nontender  Blisters on medial great toes- tender/ no signs of infection     Neurological:     Mental Status: She is alert. Mental status is at baseline.     Cranial Nerves: No cranial nerve deficit.     Motor: No abnormal muscle tone.     Coordination: Coordination normal.     Gait: Gait normal.     Deep Tendon Reflexes: Reflexes are normal and symmetric. Reflexes normal.  Psychiatric:        Mood and Affect: Mood normal.        Cognition and Memory: Memory normal.           Assessment & Plan:   Problem List Items Addressed This Visit      Endocrine   Hyperlipidemia associated with type 2 diabetes mellitus ([place])    H/o  hypertriglyceridemia  Trig are down to 251 with better  diet  Intol of all cholesterol medicines tried  Disc goals for lipids and reasons to control them Rev last labs with pt Rev low sat fat diet in detail        Relevant Medications   metFORMIN (GLUCOPHAGE) 1000 MG tablet   lisinopril (ZESTRIL) 10 MG tablet   Type 2 diabetes  mellitus without complications ([facility])    Lab Results  Component Value Date   HGBA1C 6.3 12/12/2019   Much improvement with better diet/exercise and wt loss  Eye exam planned for January  Plan to continue metformin 1000 mg bid (she sometimes takes less) Noted elevated microalb- will start lisinopril 5 mg for renal protection  Enc her to keep up the good work      Relevant Medications   metFORMIN (GLUCOPHAGE) 1000 MG tablet   lisinopril (ZESTRIL) 10 MG tablet     Other   Smoking    Disc in detail risks of smoking and possible outcomes including copd, vascular/ heart disease, cancer , respiratory and sinus infections  Pt voices understanding Pt is not ready to quit now unfortunately       Blister    Feet- medial great toes from her steel toed shoe Strongly enc to find better fitting shoes  Urged to consider podiatry referral to help as well  As a diabetic , inc risk of infection/pt is aware       Class 1 obesity due to excess calories with serious comorbidity and body mass index (BMI) of 34.0 to 34.9 in adult    Discussed how this problem influences overall health and the risks it imposes  Reviewed plan for weight loss with lower calorie diet (via better food choices and also portion control or program like weight watchers) and exercise building up to or more than 30 minutes 5 days per week including some aerobic activity   Commended the great work so far      Relevant Medications   metFORMIN (GLUCOPHAGE) 1000 MG tablet   Hypertriglyceridemia    Much improved with diet  Trig down to 251  Unable to tolerate any cholesterol medication and niacin did not work  Disc goals for lipids and reasons to control them Rev last labs with pt Rev low sat fat diet in detail       Relevant Medications   furosemide (LASIX) 20 MG tablet   lisinopril (ZESTRIL) 10 MG tablet   Encounter for general adult medical examination with abnormal findings - Primary    Reviewed health habits  including diet and exercise and skin cancer prevention Reviewed appropriate screening tests for age  Also reviewed health mt list, fam hx and immunization status , as well as social and family history   See HPI Labs reviewed  Commended on wt loss and better habits Not covid immunized -declines, questions answered  Flu shot given today  Planning her eye exam for January  Encouraged smoking cessation        Pain in left leg    Discomfort and numbness in L outer thigh Disc possible meralgia paresthetica  Will try ibuprofen instead of BC (and take with food)  inst to update if no improvement Wt loss may also help      Mucous cyst of finger    Warned not to poke or open area  Consider hand specialist referral if this does not resolve       Other Visit Diagnoses    Need for influenza vaccination  Relevant Orders   Flu Vaccine QUAD 6+ mos PF IM (Fluarix Quad PF) (Completed)

## 2019-12-23 NOTE — Assessment & Plan Note (Signed)
Reviewed health habits including diet and exercise and skin cancer prevention Reviewed appropriate screening tests for age  Also reviewed health mt list, fam hx and immunization status , as well as social and family history   See HPI Labs reviewed  Commended on wt loss and better habits Not covid immunized -declines, questions answered  Flu shot given today  Planning her eye exam for January  Encouraged smoking cessation

## 2019-12-23 NOTE — Assessment & Plan Note (Signed)
Much improved with diet  Trig down to 251  Unable to tolerate any cholesterol medication and niacin did not work  Disc goals for lipids and reasons to control them Rev last labs with pt Rev low sat fat diet in detail

## 2019-12-30 ENCOUNTER — Encounter: Payer: Self-pay | Admitting: Family Medicine

## 2020-01-15 DIAGNOSIS — M5442 Lumbago with sciatica, left side: Secondary | ICD-10-CM | POA: Diagnosis not present

## 2020-01-15 DIAGNOSIS — M9903 Segmental and somatic dysfunction of lumbar region: Secondary | ICD-10-CM | POA: Diagnosis not present

## 2020-01-15 DIAGNOSIS — M5414 Radiculopathy, thoracic region: Secondary | ICD-10-CM | POA: Diagnosis not present

## 2020-01-15 DIAGNOSIS — M9902 Segmental and somatic dysfunction of thoracic region: Secondary | ICD-10-CM | POA: Diagnosis not present

## 2020-01-21 DIAGNOSIS — M9902 Segmental and somatic dysfunction of thoracic region: Secondary | ICD-10-CM | POA: Diagnosis not present

## 2020-01-21 DIAGNOSIS — M9903 Segmental and somatic dysfunction of lumbar region: Secondary | ICD-10-CM | POA: Diagnosis not present

## 2020-01-21 DIAGNOSIS — M5414 Radiculopathy, thoracic region: Secondary | ICD-10-CM | POA: Diagnosis not present

## 2020-01-21 DIAGNOSIS — M5442 Lumbago with sciatica, left side: Secondary | ICD-10-CM | POA: Diagnosis not present

## 2020-02-06 ENCOUNTER — Other Ambulatory Visit: Payer: Self-pay | Admitting: *Deleted

## 2020-02-06 MED ORDER — METFORMIN HCL 1000 MG PO TABS
1000.0000 mg | ORAL_TABLET | Freq: Two times a day (BID) | ORAL | 0 refills | Status: DC
Start: 2020-02-06 — End: 2020-03-18

## 2020-02-11 ENCOUNTER — Other Ambulatory Visit: Payer: Self-pay | Admitting: *Deleted

## 2020-02-11 MED ORDER — LISINOPRIL 10 MG PO TABS
5.0000 mg | ORAL_TABLET | Freq: Every day | ORAL | 2 refills | Status: DC
Start: 2020-02-11 — End: 2020-11-29

## 2020-02-11 NOTE — Telephone Encounter (Signed)
Received fax requesting Rx to be sent to Mail order pharmacy, done

## 2020-03-18 ENCOUNTER — Other Ambulatory Visit: Payer: Self-pay | Admitting: Family Medicine

## 2020-04-16 ENCOUNTER — Encounter: Payer: Self-pay | Admitting: Family Medicine

## 2020-04-16 ENCOUNTER — Other Ambulatory Visit: Payer: Self-pay

## 2020-04-16 ENCOUNTER — Ambulatory Visit (INDEPENDENT_AMBULATORY_CARE_PROVIDER_SITE_OTHER): Payer: BC Managed Care – PPO | Admitting: Family Medicine

## 2020-04-16 DIAGNOSIS — B309 Viral conjunctivitis, unspecified: Secondary | ICD-10-CM | POA: Diagnosis not present

## 2020-04-16 DIAGNOSIS — H1031 Unspecified acute conjunctivitis, right eye: Secondary | ICD-10-CM | POA: Insufficient documentation

## 2020-04-16 MED ORDER — POLYMYXIN B-TRIMETHOPRIM 10000-0.1 UNIT/ML-% OP SOLN: 1.0000 [drp] | Freq: Four times a day (QID) | OPHTHALMIC | 0 refills | Status: DC

## 2020-04-16 NOTE — Progress Notes (Signed)
Patient ID: ALETTA EDMUNDS, female    DOB: [birth year], 60 y.o.   MRN: [id number]  This visit was conducted in person.  BP 118/70   Pulse 84   Temp 97.7 F (36.5 C) (Temporal)   Ht 5' 6.5" (1.689 m)   Wt 207 lb 7 oz (94.1 kg)   SpO2 97%   BMI 32.98 kg/m    CC: R eye redness/itch Subjective:   HPI: [name] is a 60 y.o. female presenting on 04/16/2020 for Conjunctivitis (C/o redness in right eye and itchy.  Eye was matted shut this morning.)   Woke up this morning and R eye was matted shut - itching and irritating.  No sick contacts at home. Coworker's son also had pink eye 2 days ago.  Oozing yellow discharge from right eye.  Left eye feels fine.  Known h/o allergic rhinitis over the past 2 years.   No fevers/chills, nasal congestion, cough, rhinorrhea or sneezing, ST, PNdrainage.  No vision changes   Last saw eye doctor last week - normal exam except she does need reading glasses.      Relevant past medical, surgical, family and social history reviewed and updated as indicated. Interim medical history since our last visit reviewed. Allergies and medications reviewed and updated. Outpatient Medications Prior to Visit  Medication Sig Dispense Refill  . albuterol (PROVENTIL HFA;VENTOLIN HFA) 108 (90 Base) MCG/ACT inhaler 2 puffs before exercise or as needed when short of breath 1 Inhaler 2  . furosemide (LASIX) 20 MG tablet TAKE 1 TABLET BY MOUTH TWICE A DAY IF NEEDED 60 tablet 11  . lisinopril (ZESTRIL) 10 MG tablet Take 0.5 tablets (5 mg total) by mouth daily. 90 tablet 2  . metFORMIN (GLUCOPHAGE) 1000 MG tablet TAKE 1 TABLET BY MOUTH  TWICE DAILY WITH A MEAL 180 tablet 1   No facility-administered medications prior to visit.     Per HPI unless specifically indicated in ROS section below Review of Systems Objective:  BP 118/70   Pulse 84   Temp 97.7 F (36.5 C) (Temporal)   Ht 5' 6.5" (1.689 m)   Wt 207 lb 7 oz (94.1 kg)   SpO2 97%   BMI 32.98 kg/m   Wt  Readings from Last 3 Encounters:  04/16/20 207 lb 7 oz (94.1 kg)  12/23/19 216 lb 2 oz (98 kg)  08/15/19 (!) 230 lb 3 oz (104.4 kg)      Physical Exam Vitals and nursing note reviewed.  Constitutional:      Appearance: Normal appearance. She is not ill-appearing.  HENT:     Head: Normocephalic and atraumatic.     Nose: Nose normal.     Mouth/Throat:     Mouth: Mucous membranes are moist.     Pharynx: Oropharynx is clear. No oropharyngeal exudate or posterior oropharyngeal erythema.  Eyes:     General: Lids are everted, no foreign bodies appreciated.        Right eye: Discharge (clear) present.        Left eye: No discharge.     Extraocular Movements: Extraocular movements intact.     Right eye: Normal extraocular motion.     Left eye: Normal extraocular motion.     Conjunctiva/sclera:     Right eye: Right conjunctiva is injected. No exudate.    Left eye: Left conjunctiva is not injected. No exudate.    Comments: R bulbar and palpebral conjunctival injection, no foreign object identified with lid eversion. Mild swelling  periorbitally.   Neurological:     Mental Status: She is alert.       Assessment & Plan:  This visit occurred during the SARS-CoV-2 public health emergency.  Safety protocols were in place, including screening questions prior to the visit, additional usage of staff PPE, and extensive cleaning of exam room while observing appropriate contact time as indicated for disinfecting solutions.   Problem List Items Addressed This Visit    Acute conjunctivitis of right eye    Anticipate viral conjunctivitis given symptoms and exam.  Not consistent with allergic or bacterial conjunctivitis.  No vision changes. Supportive care reviewed. Update if not improving with treatment. WASP Rx for polytrim abx printed for patient with indications when to use.           Meds ordered this encounter  Medications  . trimethoprim-polymyxin b (POLYTRIM) ophthalmic solution    Sig:  Place 1 drop into the right eye every 6 (six) hours.    Dispense:  10 mL    Refill:  0   No orders of the defined types were placed in this encounter.   Patient instructions: I think you have viral pink eye Treat with cool compresses to the affected eye. May use lubricating eye drops as needed.  Lots of hand washing as this is contagious.  Should improve over next few days.  If worsening drainage or pain or not improving as expected, fill antibiotic eye drop printed out today and start taking.  Follow up plan: Return if symptoms worsen or fail to improve.  Tiffany Bush, MD

## 2020-04-16 NOTE — Assessment & Plan Note (Signed)
Anticipate viral conjunctivitis given symptoms and exam.  Not consistent with allergic or bacterial conjunctivitis.  No vision changes. Supportive care reviewed. Update if not improving with treatment. WASP Rx for polytrim abx printed for patient with indications when to use.

## 2020-04-16 NOTE — Patient Instructions (Signed)
I think you have viral pink eye Treat with cool compresses to the affected eye. May use lubricating eye drops as needed.  Lots of hand washing as this is contagious.  Should improve over next few days.  If worsening drainage or pain or not improving as expected, fill antibiotic eye drop printed out today and start taking.  Viral Conjunctivitis, Adult  Viral conjunctivitis is an inflammation of the clear membrane that covers the white part of the eye and the inner surface of the eyelid (conjunctiva). The inflammation is caused by a viral infection. The blood vessels in the conjunctiva become enlarged, causing the eye to become red or pink and often itchy. It usually starts in one eye and goes to the other in a day or two. Infections often resolve over 1-2 weeks. Viral conjunctivitis is contagious. This means it can be easily passed from one person to another. This condition is often called pink eye. What are the causes? This condition is caused by a virus. It can be spread by touching objects that have been contaminated with the virus, such as doorknobs or towels, and then touching your eye. It can also be passed through tiny droplets, such as from coughing or sneezing. What increases the risk? You are more likely to develop this condition if you have a cold or the flu, or are in close contact with a person with pink eye. What are the signs or symptoms? Symptoms of this condition include:  Redness in the eye.  Tearing or watery eyes.  Itchy and irritated eyes.  Burning feeling in the eyes.  Clear drainage from the eye.  Swollen eyelids.  A gritty feeling in the eye.  Light sensitivity. This condition often occurs with other symptoms, such as nasal congestion, cough, and fever. How is this diagnosed? This condition is diagnosed with a medical history and physical exam. If you have discharge from your eye, the discharge may be tested to rule out other causes of conjunctivitis. How is  this treated? Viral conjunctivitis does not respond to medicines that kill bacteria (antibiotics). The condition most often resolves on its own in 1-2 weeks. If treatment is needed, it is aimed at relieving your symptoms and preventing the spread of infection. This may be done with artificial tear drops, antihistamine drops, or other eye medicines. In rare cases, steroid eye drops or anti-herpes virus medicines may be prescribed. Follow these instructions at home: Medicines  Take or apply over-the-counter and prescription medicines only as told by your health care provider.  Do not touch the edge of the eyelid with the eye-drop bottle or ointment tube when applying medicines to the affected eye. This will prevent the spread of the infection to the other eye or to other people.   Eye care  Avoid touching or rubbing your eyes.  Apply a clean, cool, wet washcloth onto your eye for 10-20 minutes, 3-4 times per day, or as told by your health care provider.  If you wear contact lenses, do not wear them until the inflammation is gone and your health care provider says it is safe to wear them again. Ask your health care provider how to disinfect or replace your contact lenses before using them again. Wear glasses until you can resume wearing contacts.  Avoid wearing eye makeup until the inflammation is gone. Throw away any old eye cosmetics that may be contaminated.  Gently wipe away any crusting from your eye with a wet washcloth or a cotton ball. General instructions  Change or wash your pillowcase every day or as told by your health care provider.  Do not share towels, pillowcases, washcloths, eye makeup, makeup brushes, contact lenses, or eyeglasses. This may spread the infection.  Wash your hands often with soap and water. Use paper towels to dry your hands. If soap and water are not available, use hand sanitizer.  Avoid contact with other people until your eye is no longer red and tearing,  or as told by your health care provider. Contact a health care provider if:  Your symptoms do not improve with treatment, or they get worse.  You have increased pain.  Your vision becomes blurry.  You have a fever.  You have facial pain, redness, or swelling.  You have yellow or green drainage coming from your eye.  You have new symptoms. Get help right away if:  You develop severe pain.  Your vision gets much worse. Summary  Viral conjunctivitis is an inflammation of the clear membrane that covers the white part of the eye and the inner surface of the eyelid. It usually goes away in 1-2 weeks.  This condition is usually treated with medicines and cold compresses. Treatment focuses on relieving the symptoms.  This condition is very contagious. To prevent infection, avoid close contact with others, wash your hands often, and do not share towels or washcloths.  Contact a health care provider if your symptoms do not go away with treatment, or if you have more pain, poor vision, or swelling in the eyes.  Get help right away if you have severe pain or your vision gets much worse. This information is not intended to replace advice given to you by your health care provider. Make sure you discuss any questions you have with your health care provider. Document Revised: 12/09/2018 Document Reviewed: 11/22/2018 Elsevier Patient Education  2021 [employer].

## 2020-09-05 ENCOUNTER — Other Ambulatory Visit: Payer: Self-pay | Admitting: Family Medicine

## 2020-11-29 ENCOUNTER — Other Ambulatory Visit: Payer: Self-pay

## 2020-11-29 ENCOUNTER — Encounter: Payer: Self-pay | Admitting: Family Medicine

## 2020-11-29 ENCOUNTER — Other Ambulatory Visit: Payer: Self-pay | Admitting: Family Medicine

## 2020-11-29 ENCOUNTER — Ambulatory Visit (INDEPENDENT_AMBULATORY_CARE_PROVIDER_SITE_OTHER): Payer: BC Managed Care – PPO | Admitting: Family Medicine

## 2020-11-29 VITALS — BP 134/68 | HR 110 | Temp 98.2°F | Ht 66.5 in | Wt 187.0 lb

## 2020-11-29 DIAGNOSIS — R7303 Prediabetes: Secondary | ICD-10-CM

## 2020-11-29 DIAGNOSIS — E119 Type 2 diabetes mellitus without complications: Secondary | ICD-10-CM

## 2020-11-29 DIAGNOSIS — Z1231 Encounter for screening mammogram for malignant neoplasm of breast: Secondary | ICD-10-CM | POA: Insufficient documentation

## 2020-11-29 DIAGNOSIS — F17200 Nicotine dependence, unspecified, uncomplicated: Secondary | ICD-10-CM

## 2020-11-29 DIAGNOSIS — E781 Pure hyperglyceridemia: Secondary | ICD-10-CM

## 2020-11-29 LAB — POCT GLYCOSYLATED HEMOGLOBIN (HGB A1C): Hemoglobin A1C: 5.6 % (ref 4.0–5.6)

## 2020-11-29 NOTE — Assessment & Plan Note (Signed)
Hope for improvement with a much improved diet  Intolerant of fibrate and also niacin  Disc goals for lipids and reasons to control them Rev last labs with pt Rev low sat fat diet in detail Labs ordered today

## 2020-11-29 NOTE — Assessment & Plan Note (Signed)
Lab Results  Component Value Date   HGBA1C 5.6 11/29/2020   Improved  Off medication (intol of metformin/ GI) Enc her to continue low glycemic diet  No longer taking ace  Labs ordered

## 2020-11-29 NOTE — Progress Notes (Signed)
Subjective:    Patient ID: [name], female    DOB: [birth year], 60 y.o.   MRN: [id number]  This visit occurred during the SARS-CoV-2 public health emergency.  Safety protocols were in place, including screening questions prior to the visit, additional usage of staff PPE, and extensive cleaning of exam room while observing appropriate contact time as indicated for disinfecting solutions.   HPI Pt presents for f/u of DM2  Wt Readings from Last 3 Encounters:  11/29/20 187 lb (84.8 kg)  04/16/20 207 lb 7 oz (94.1 kg)  12/23/19 216 lb 2 oz (98 kg)   29.73 kg/m  Cut out all the bad food  She does not crave it anymore  No more sweet tea   Thrilled!  More energy   DM2 Lab Results  Component Value Date   HGBA1C 5.6 11/29/2020   Well controlled  Quit taking metformin 2 months ago due to GI issues  Had cramps in stomach and incontinent of liquid stool   Home readings 120s avg non fasting   Job is very physical  Walks at least 5 mi per day    Smoking status : occ cigarette   Took lisinopril for renal protection in the past -made her dizzy  BP Readings from Last 3 Encounters:  11/29/20 134/68  04/16/20 118/70  12/23/19 134/86   Eye exam up to date yearly  Flu shot   High triglycerides  Lab Results  Component Value Date   CHOL 243 (H) 12/12/2019   HDL 30.30 (L) 12/12/2019   LDLDIRECT 177.0 12/12/2019   TRIG 251.0 (H) 12/12/2019   CHOLHDL 8 12/12/2019   No more fried food  No red meat  Eats salmon and chicken   Occ pizza-not often    Patient Active Problem List   Diagnosis Date Noted   Encounter for screening mammogram for breast cancer 11/29/2020   Acute conjunctivitis of right eye 04/16/2020   Encounter for general adult medical examination with abnormal findings 12/23/2019   Pain in left leg 12/23/2019   Mucous cyst of finger 12/23/2019   Prediabetes 05/16/2019   Jaw pain 05/13/2019   Localized swelling, mass and lump, neck 05/13/2019   GERD  (gastroesophageal reflux disease) 05/16/2017   Blister 03/15/2016   Class 1 obesity due to excess calories with serious comorbidity and body mass index (BMI) of 34.0 to 34.9 in adult 03/15/2016   Hypertriglyceridemia 03/15/2016   Leukocytosis 04/29/2015   Fatigue 02/10/2015   Menopausal symptoms 02/10/2015   Knee pain, right 05/27/2013   Colon cancer screening 04/01/2013   Adverse effects of medication 04/07/2011   Pedal edema 04/07/2011   Shortness of breath dyspnea 02/01/2011   Hyperlipidemia associated with type 2 diabetes mellitus ([place]) 12/02/2010   Other screening mammogram 11/17/2010   PERIMENOPAUSAL STATUS 02/22/2007   ABFND, RADIOLOGICAL, BREAST NEC 08/21/2006   Smoking 08/15/2006   DEPRESSION 06/15/2006   Past Medical History:  Diagnosis Date   Depressive disorder, not elsewhere classified    Elevated blood pressure reading without diagnosis of hypertension    Leiomyoma of uterus, unspecified    Other (abnormal) findings on radiological examination of breast    Pure hypercholesterolemia    Tobacco use disorder    Past Surgical History:  Procedure Laterality Date   BLADDER SUSPENSION     BUNIONECTOMY     PARTIAL HYSTERECTOMY     one ovary remains   VAGINAL HYSTERECTOMY  2008   also had bladder tacked   Social History  Tobacco Use   Smoking status: Light Smoker    Packs/day: 0.25    Types: Cigarettes   Smokeless tobacco: Never  Substance Use Topics   Alcohol use: No    Alcohol/week: 0.0 standard drinks    Comment: once a month   Drug use: No   Family History  Problem Relation Age of Onset   Cancer Mother        lung cancer   COPD Father    Diabetes Father    Breast cancer Maternal Grandmother    Diabetes type I Paternal Grandmother    Allergies  Allergen Reactions   Midol [Aspirin-Cinnamedrine-Caffeine] Shortness Of Breath   Acetaminophen     REACTION: dizzy   Bupropion Hcl     REACTION: made worse   Hydrocodone     n/v   Ibuprofen     Lisinopril     Dizzy    Lopid [Gemfibrozil]     Caused blisters    Niaspan [Niacin Er]     Diarrhea    Penicillins     REACTION: dizzy   Current Outpatient Medications on File Prior to Visit  Medication Sig Dispense Refill   albuterol (PROVENTIL HFA;VENTOLIN HFA) 108 (90 Base) MCG/ACT inhaler 2 puffs before exercise or as needed when short of breath 1 Inhaler 2   No current facility-administered medications on file prior to visit.     Review of Systems  Constitutional:  Negative for activity change, appetite change, fever and unexpected weight change.  HENT:  Negative for congestion, ear pain, rhinorrhea, sinus pressure and sore throat.   Eyes:  Negative for pain, redness and visual disturbance.  Respiratory:  Negative for cough, shortness of breath and wheezing.   Cardiovascular:  Negative for chest pain and palpitations.  Gastrointestinal:  Negative for abdominal pain, blood in stool, constipation and diarrhea.  Endocrine: Negative for polydipsia and polyuria.  Genitourinary:  Negative for dysuria, frequency and urgency.  Musculoskeletal:  Negative for arthralgias, back pain and myalgias.  Skin:  Negative for pallor and rash.  Allergic/Immunologic: Negative for environmental allergies.  Neurological:  Negative for dizziness, syncope and headaches.  Hematological:  Negative for adenopathy. Does not bruise/bleed easily.  Psychiatric/Behavioral:  Negative for decreased concentration and dysphoric mood. The patient is not nervous/anxious.       Objective:   Physical Exam Constitutional:      General: She is not in acute distress.    Appearance: Normal appearance. She is well-developed. She is obese. She is not ill-appearing or diaphoretic.     Comments: Significant wt loss noted  HENT:     Head: Normocephalic and atraumatic.  Eyes:     Conjunctiva/sclera: Conjunctivae normal.     Pupils: Pupils are equal, round, and reactive to light.  Neck:     Thyroid: No thyromegaly.      Vascular: No carotid bruit or JVD.  Cardiovascular:     Rate and Rhythm: Normal rate and regular rhythm.     Heart sounds: Normal heart sounds.    No gallop.  Pulmonary:     Effort: Pulmonary effort is normal. No respiratory distress.     Breath sounds: Normal breath sounds. No wheezing or rales.  Abdominal:     General: Bowel sounds are normal. There is no distension or abdominal bruit.     Palpations: Abdomen is soft. There is no mass.     Tenderness: There is no abdominal tenderness.  Musculoskeletal:     Cervical back: Normal range of motion  and neck supple.     Right lower leg: No edema.     Left lower leg: No edema.  Lymphadenopathy:     Cervical: No cervical adenopathy.  Skin:    General: Skin is warm and dry.     Coloration: Skin is not pale.     Findings: No rash.  Neurological:     Mental Status: She is alert.     Sensory: No sensory deficit.     Coordination: Coordination normal.     Deep Tendon Reflexes: Reflexes are normal and symmetric. Reflexes normal.  Psychiatric:        Mood and Affect: Mood normal.          Assessment & Plan:   Problem List Items Addressed This Visit       Other   Smoking    Only occasional Disc in detail risks of smoking and possible outcomes including copd, vascular/ heart disease, cancer , respiratory and sinus infections  Pt voices understanding Encouraged strongly to quit      Hypertriglyceridemia    Hope for improvement with a much improved diet  Intolerant of fibrate and also niacin  Disc goals for lipids and reasons to control them Rev last labs with pt Rev low sat fat diet in detail Labs ordered today       Relevant Orders   Comprehensive metabolic panel   Lipid panel   Prediabetes - Primary    Lab Results  Component Value Date   HGBA1C 5.6 11/29/2020  Improved  Off medication (intol of metformin/ GI) Enc her to continue low glycemic diet  No longer taking ace  Labs ordered       Relevant Orders    Comprehensive metabolic panel   Encounter for screening mammogram for breast cancer    Due for annual screening  Ordered at [place] and pt will call to schedule      Relevant Orders   MM 3D SCREEN BREAST BILATERAL

## 2020-11-29 NOTE — Assessment & Plan Note (Signed)
Only occasional Disc in detail risks of smoking and possible outcomes including copd, vascular/ heart disease, cancer , respiratory and sinus infections  Pt voices understanding Encouraged strongly to quit

## 2020-11-29 NOTE — Assessment & Plan Note (Signed)
Due for annual screening  Ordered at [place] and pt will call to schedule

## 2020-11-29 NOTE — Patient Instructions (Signed)
Call and schedule your mammogram  Lab today   Keep up the good work with diet and weight loss   Try to eliminate smoking   Stay active   Flu shot today

## 2020-11-30 LAB — COMPREHENSIVE METABOLIC PANEL
ALT: 10 U/L (ref 0–35)
AST: 12 U/L (ref 0–37)
Albumin: 4.4 g/dL (ref 3.5–5.2)
Alkaline Phosphatase: 64 U/L (ref 39–117)
BUN: 18 mg/dL (ref 6–23)
CO2: 26 mEq/L (ref 19–32)
Calcium: 9.5 mg/dL (ref 8.4–10.5)
Chloride: 104 mEq/L (ref 96–112)
Creatinine, Ser: 0.75 mg/dL (ref 0.40–1.20)
GFR: 87.17 mL/min (ref >60.00)
Glucose, Bld: 82 mg/dL (ref 70–99)
Potassium: 4 mEq/L (ref 3.5–5.1)
Sodium: 138 mEq/L (ref 135–145)
Total Bilirubin: 0.3 mg/dL (ref 0.2–1.2)
Total Protein: 7 g/dL (ref 6.0–8.3)

## 2020-11-30 LAB — LIPID PANEL
Cholesterol: 283 mg/dL — ABNORMAL HIGH (ref 0–200)
HDL: 36.2 mg/dL — ABNORMAL LOW (ref >39.00)
NonHDL: 247.23
Total CHOL/HDL Ratio: 8
Triglycerides: 328 mg/dL — ABNORMAL HIGH (ref 0.0–149.0)
VLDL: 65.6 mg/dL — ABNORMAL HIGH (ref 0.0–40.0)

## 2020-11-30 LAB — LDL CHOLESTEROL, DIRECT: Direct LDL: 200 mg/dL

## 2021-02-24 ENCOUNTER — Other Ambulatory Visit: Payer: Self-pay | Admitting: Family Medicine

## 2021-05-05 ENCOUNTER — Telehealth: Payer: Self-pay

## 2021-05-05 NOTE — Telephone Encounter (Signed)
New message    Patient C/o cough for two week - concerning blowing blood clots / green mucus   No Emergency room visit   Patient is asking for an antibiotic be called in , I advise the patient an appointment with the MD/ NP is needed.

## 2021-05-05 NOTE — Telephone Encounter (Signed)
Call in the morning on [phone].  If she has not been seen at urgent care please work her into my schedule. ?

## 2021-05-05 NOTE — Telephone Encounter (Signed)
I spoke with pt and she said for 2 wks has had cough; now prod cough with green mucus, no wheezing, CP or SOB. Pt said now when she blows her nose blood and blood clots come out and it is not blood tinged mucus it is mostly blood. Seeing blood and blood clots when blows nose started 05/04/21. Pt said she has not had a fever since 04/29/21. Pt is in no distress but said grandson recently dx with pneumonia and pt is afraid she has bronchitis or pneumonia and wants abx called in. Advised pt needs to be seen and eval with possible xray. Pt voiced understanding and said she is watching her two grandchildren now and if they get picked up before 8 PM pt will go to [place] and if after 8PM pt will go to ED. Dr Glori Bickers is out of office this afternoon and sending note to Dr Diona Browner who is in office and [place] CMA.  ?

## 2021-05-06 NOTE — Telephone Encounter (Signed)
Aware and agree with UC/ER advisement  ?I am currently not in the office ?Will watch out for correspondence  ?

## 2021-05-06 NOTE — Telephone Encounter (Signed)
Called pt and offered her an appt with Dr. Diona Browner (only appt left in office) and pt declined she said she is working today and can't get off till after 2:30, no appts available this afternoon so pt said she will go to [facility] when she gets off. FYI to PCP and Dr. Diona Browner  ?

## 2021-05-06 NOTE — Telephone Encounter (Signed)
Noted  

## 2022-01-06 ENCOUNTER — Encounter: Payer: Self-pay | Admitting: Family Medicine

## 2022-01-06 ENCOUNTER — Ambulatory Visit (INDEPENDENT_AMBULATORY_CARE_PROVIDER_SITE_OTHER): Payer: BC Managed Care – PPO | Admitting: Family Medicine

## 2022-01-06 VITALS — BP 140/70 | HR 89 | Temp 99.8°F | Ht 67.0 in | Wt 196.0 lb

## 2022-01-06 DIAGNOSIS — J0100 Acute maxillary sinusitis, unspecified: Secondary | ICD-10-CM | POA: Diagnosis not present

## 2022-01-06 MED ORDER — AZITHROMYCIN 250 MG PO TABS: ORAL_TABLET | ORAL | 0 refills | Status: DC

## 2022-01-06 NOTE — Progress Notes (Signed)
Patient ID: [name], female    DOB: [birth year], 61 y.o.   MRN: [id number]  This visit was conducted in person.  BP (!) 140/70   Pulse 89   Temp 99.8 F (37.7 C) (Oral)   Ht '5\' 7"'$  (1.702 m)   Wt 196 lb (88.9 kg)   SpO2 96%   BMI 30.70 kg/m    CC:  Chief Complaint  Patient presents with   Nasal Congestion    Started 3 weeks ago   Facial Pain    Subjective:   HPI: [name] is a 61 y.o. female  pt of Dr.,[facility] presenting on 01/06/2022 for Nasal Congestion (Started 3 weeks ago) and Facial Pain    Date of onset: 3 weeks ago Initial symptoms included  nasal congestion Symptoms progressed to Bilateral ear pain and pressure ( most o right eye),  purulent nasal discharge facial pain.  Occ cough with PND.  NO SOB, no wheeze. No fever  Occ body aches   Cough keeping her up at night.   Sick contacts:  multiple sick contacts. COVID testing:   none     She has tried to treat with  alka seltzer plus, mucinex   No history of chronic lung disease such as asthma or COPD. Non-smoker.=, former smoker.       Relevant past medical, surgical, family and social history reviewed and updated as indicated. Interim medical history since our last visit reviewed. Allergies and medications reviewed and updated. Outpatient Medications Prior to Visit  Medication Sig Dispense Refill   albuterol (PROVENTIL HFA;VENTOLIN HFA) 108 (90 Base) MCG/ACT inhaler 2 puffs before exercise or as needed when short of breath 1 Inhaler 2   No facility-administered medications prior to visit.     Per HPI unless specifically indicated in ROS section below Review of Systems  Constitutional:  Negative for fatigue and fever.  HENT:  Positive for congestion and sinus pressure.   Eyes:  Negative for pain.  Respiratory:  Positive for cough. Negative for shortness of breath.   Cardiovascular:  Negative for chest pain, palpitations and leg swelling.  Gastrointestinal:  Negative for abdominal pain.   Genitourinary:  Negative for dysuria and vaginal bleeding.  Musculoskeletal:  Negative for back pain.  Neurological:  Negative for syncope, light-headedness and headaches.  Psychiatric/Behavioral:  Negative for dysphoric mood.    Objective:  BP (!) 140/70   Pulse 89   Temp 99.8 F (37.7 C) (Oral)   Ht '5\' 7"'$  (1.702 m)   Wt 196 lb (88.9 kg)   SpO2 96%   BMI 30.70 kg/m   Wt Readings from Last 3 Encounters:  01/06/22 196 lb (88.9 kg)  11/29/20 187 lb (84.8 kg)  04/16/20 207 lb 7 oz (94.1 kg)      Physical Exam Constitutional:      General: She is not in acute distress.    Appearance: Normal appearance. She is well-developed. She is not ill-appearing or toxic-appearing.  HENT:     Head: Normocephalic.     Right Ear: Hearing, tympanic membrane, ear canal and external ear normal. Tympanic membrane is not erythematous, retracted or bulging.     Left Ear: Hearing, tympanic membrane, ear canal and external ear normal. Tympanic membrane is not erythematous, retracted or bulging.     Nose: No mucosal edema or rhinorrhea.     Right Sinus: Maxillary sinus tenderness present. No frontal sinus tenderness.     Left Sinus: Maxillary sinus tenderness present. No frontal  sinus tenderness.     Mouth/Throat:     Pharynx: Uvula midline.  Eyes:     General: Lids are normal. Lids are everted, no foreign bodies appreciated.     Conjunctiva/sclera: Conjunctivae normal.     Pupils: Pupils are equal, round, and reactive to light.  Neck:     Thyroid: No thyroid mass or thyromegaly.     Vascular: No carotid bruit.     Trachea: Trachea normal.  Cardiovascular:     Rate and Rhythm: Normal rate and regular rhythm.     Pulses: Normal pulses.     Heart sounds: Normal heart sounds, S1 normal and S2 normal. No murmur heard.    No friction rub. No gallop.  Pulmonary:     Effort: Pulmonary effort is normal. No tachypnea or respiratory distress.     Breath sounds: Normal breath sounds. No decreased breath  sounds, wheezing, rhonchi or rales.  Abdominal:     General: Bowel sounds are normal.     Palpations: Abdomen is soft.     Tenderness: There is no abdominal tenderness.  Musculoskeletal:     Cervical back: Normal range of motion and neck supple.  Skin:    General: Skin is warm and dry.     Findings: No rash.  Neurological:     Mental Status: She is alert.  Psychiatric:        Mood and Affect: Mood is not anxious or depressed.        Speech: Speech normal.        Behavior: Behavior normal. Behavior is cooperative.        Thought Content: Thought content normal.        Judgment: Judgment normal.       Results for orders placed or performed in visit on 11/29/20  Comprehensive metabolic panel  Result Value Ref Range   Sodium 138 135 - 145 mEq/L   Potassium 4.0 3.5 - 5.1 mEq/L   Chloride 104 96 - 112 mEq/L   CO2 26 19 - 32 mEq/L   Glucose, Bld 82 70 - 99 mg/dL   BUN 18 6 - 23 mg/dL   Creatinine, Ser 0.75 0.40 - 1.20 mg/dL   Total Bilirubin 0.3 0.2 - 1.2 mg/dL   Alkaline Phosphatase 64 39 - 117 U/L   AST 12 0 - 37 U/L   ALT 10 0 - 35 U/L   Total Protein 7.0 6.0 - 8.3 g/dL   Albumin 4.4 3.5 - 5.2 g/dL   GFR 87.17 >60.00 mL/min   Calcium 9.5 8.4 - 10.5 mg/dL  Lipid panel  Result Value Ref Range   Cholesterol 283 (H) 0 - 200 mg/dL   Triglycerides 328.0 (H) 0.0 - 149.0 mg/dL   HDL 36.20 (L) >39.00 mg/dL   VLDL 65.6 (H) 0.0 - 40.0 mg/dL   Total CHOL/HDL Ratio 8    NonHDL 247.23   LDL cholesterol, direct  Result Value Ref Range   Direct LDL 200.0 mg/dL  POCT glycosylated hemoglobin (Hb A1C)  Result Value Ref Range   Hemoglobin A1C 5.6 4.0 - 5.6 %   HbA1c POC (<> result, manual entry)     HbA1c, POC (prediabetic range)     HbA1c, POC (controlled diabetic range)       COVID 19 screen:  No recent travel or known exposure to COVID19 The patient denies respiratory symptoms of COVID 19 at this time. The importance of social distancing was discussed today.   Assessment and  Plan Acute  non-recurrent maxillary sinusitis Assessment & Plan: Start nasal saline spray to irrigate sinus. Then use flonase 2 spray per nostril.  Continue Mucinex DM.  Complete antibiotics.  Call if not improving as expected .  Go to ER if severe shortness of breath.   Other orders -     Azithromycin; 2 tab po x 1 day then 1 tab po daily  Dispense: 6 tablet; Refill: 0        Eliezer Lofts, MD

## 2022-01-06 NOTE — Patient Instructions (Addendum)
Start nasal saline spray to irrigate sinus. Then use flonase 2 spray per nostril.  Continue Mucinex DM.  Complete antibiotics.  Call if not improving as expected .  Go to ER if severe shortness of breath.

## 2022-01-09 ENCOUNTER — Ambulatory Visit: Payer: BC Managed Care – PPO | Admitting: Family Medicine

## 2022-02-13 DIAGNOSIS — J0100 Acute maxillary sinusitis, unspecified: Secondary | ICD-10-CM | POA: Insufficient documentation

## 2022-02-13 NOTE — Assessment & Plan Note (Signed)
Start nasal saline spray to irrigate sinus. Then use flonase 2 spray per nostril.  Continue Mucinex DM.  Complete antibiotics.  Call if not improving as expected .  Go to ER if severe shortness of breath.

## 2022-02-18 ENCOUNTER — Encounter: Payer: Self-pay | Admitting: Family Medicine

## 2022-02-20 NOTE — Telephone Encounter (Signed)
No visit since 11/2020 /no labs either I need to see her /examine her/get vitals and labs before I can refill lasix Please schedule in office visit

## 2022-03-06 ENCOUNTER — Ambulatory Visit (INDEPENDENT_AMBULATORY_CARE_PROVIDER_SITE_OTHER): Payer: BC Managed Care – PPO | Admitting: Podiatry

## 2022-03-06 ENCOUNTER — Encounter: Payer: Self-pay | Admitting: Podiatry

## 2022-03-06 ENCOUNTER — Ambulatory Visit (INDEPENDENT_AMBULATORY_CARE_PROVIDER_SITE_OTHER): Payer: BC Managed Care – PPO

## 2022-03-06 DIAGNOSIS — G5781 Other specified mononeuropathies of right lower limb: Secondary | ICD-10-CM | POA: Diagnosis not present

## 2022-03-06 DIAGNOSIS — M778 Other enthesopathies, not elsewhere classified: Secondary | ICD-10-CM

## 2022-03-06 MED ORDER — TRIAMCINOLONE ACETONIDE 40 MG/ML IJ SUSP
40.0000 mg | Freq: Once | INTRAMUSCULAR | Status: AC
  Administered 2022-03-06: 40 mg

## 2022-03-06 NOTE — Progress Notes (Signed)
Subjective:  Patient ID: [name], female    DOB: [birth year],  MRN: [id number] HPI Chief Complaint  Patient presents with   Foot Pain    Toes right - numbness x 1 year, initially started in the last 3 toes, now feeling the numbness in the 1st and 2nd now too, also feeling like her sock is bunched up under the toes, wears steel toe shoes at work, no treatment   New Patient (Initial Visit)    62 y.o. female presents with the above complaint.   ROS: Denies fever chills nausea vomit muscle aches pains calf pain back pain chest pain shortness of breath.  Past Medical History:  Diagnosis Date   Depressive disorder, not elsewhere classified    Elevated blood pressure reading without diagnosis of hypertension    Leiomyoma of uterus, unspecified    Other (abnormal) findings on radiological examination of breast    Pure hypercholesterolemia    Tobacco use disorder    Past Surgical History:  Procedure Laterality Date   BLADDER SUSPENSION     BUNIONECTOMY     PARTIAL HYSTERECTOMY     one ovary remains   VAGINAL HYSTERECTOMY  2008   also had bladder tacked   No current outpatient medications on file.  Allergies  Allergen Reactions   Midol [Aspirin-Cinnamedrine-Caffeine] Shortness Of Breath   Acetaminophen     REACTION: dizzy   Bupropion Hcl     REACTION: made worse   Hydrocodone     n/v   Ibuprofen    Lisinopril     Dizzy    Lopid [Gemfibrozil]     Caused blisters    Metformin And Related     GI Stomach cramping and diarrhea    Niaspan [Niacin Er]     Diarrhea    Penicillins     REACTION: dizzy   Review of Systems Objective:  There were no vitals filed for this visit.  General: Well developed, nourished, in no acute distress, alert and oriented x3   Dermatological: Skin is warm, dry and supple bilateral. Nails x 10 are well maintained; remaining integument appears unremarkable at this time. There are no open sores, no preulcerative lesions, no rash or signs of  infection present.  Vascular: Dorsalis Pedis artery and Posterior Tibial artery pedal pulses are 2/4 bilateral with immedate capillary fill time. Pedal hair growth present. No varicosities and no lower extremity edema present bilateral.   Neruologic: Grossly intact via light touch bilateral. Vibratory intact via tuning fork bilateral. Protective threshold with Semmes Wienstein monofilament intact to all pedal sites bilateral. Patellar and Achilles deep tendon reflexes 2+ bilateral. No Babinski or clonus noted bilateral.  Palpable neuroma third interdigital space of the right foot.  Musculoskeletal: No gross boney pedal deformities bilateral. No pain, crepitus, or limitation noted with foot and ankle range of motion bilateral. Muscular strength 5/5 in all groups tested bilateral.  She also has pain on end range of motion of the second metatarsophalangeal joint consistent with osteoarthritic change.  Hallux abductovalgus deformity is noted with no crepitation on range of motion.  Is nonreducible. Gait: Unassisted, Nonantalgic.    Radiographs:  Radiographs taken today demonstrate osseously mature foot decent mineralization of the bones.  She also has no significant osteoarthritic change at the second metatarsophalangeal joint though early mild hammertoe deformity may be noticed lateral view.  She is got posterior and plantar calcaneal heel spurs but no thickening of the soft tissue surrounding them.  Hallux valgus deformity is noted right.  Some osteoarthritic changes noted here.  Assessment & Plan:   Assessment: Neuroma third interspace of the right foot capsulitis of the second metatarsophalangeal joint right foot  Plan: Discussed etiology pathology conservative surgical therapies at this point I injected 10 mg of Kenalog to the third interspace of the right foot and around the second metatarsal phalangeal joint of the right foot.  Discussed appropriate shoe gear stretching exercise ice therapy  sugar modifications I will follow-up with her in a few weeks.     Rendi Mapel T. [place], [place]

## 2022-04-14 ENCOUNTER — Encounter: Payer: BC Managed Care – PPO | Admitting: Family Medicine

## 2022-04-26 ENCOUNTER — Telehealth: Payer: Self-pay | Admitting: *Deleted

## 2022-04-26 NOTE — Telephone Encounter (Addendum)
Patient is calling to ask if she will be charged for upcoming visit since it will be a f/u from 02/12.

## 2022-05-01 ENCOUNTER — Ambulatory Visit: Payer: BC Managed Care – PPO | Admitting: Podiatry

## 2022-05-16 ENCOUNTER — Encounter: Payer: BC Managed Care – PPO | Admitting: Family Medicine

## 2022-05-16 MED ORDER — FUROSEMIDE 20 MG PO TABS: ORAL_TABLET | ORAL | 0 refills | Status: DC

## 2022-05-16 NOTE — Telephone Encounter (Signed)
Called to r/s patient this morning. She stated that she took a vacation day to come in and see Dr. Milinda Antis. She was wanting to know if there is something OTC she can take instead. Please advise. Thank you!

## 2022-05-16 NOTE — Telephone Encounter (Signed)
Pt notified of Dr. Royden Purl comments and Rx was sent to pharmacy f/u scheduled

## 2022-05-16 NOTE — Addendum Note (Signed)
Addended by: Roxy Manns A on: 05/16/2022 02:06 PM   Modules accepted: Orders

## 2022-05-16 NOTE — Telephone Encounter (Signed)
So sorry about that  I sent some lasix to pharmacy  Please schedule f/u next week - we will see her /check bp and check labs  If sympt worsen let us know

## 2022-05-22 ENCOUNTER — Encounter: Payer: Self-pay | Admitting: Family Medicine

## 2022-05-22 ENCOUNTER — Ambulatory Visit (INDEPENDENT_AMBULATORY_CARE_PROVIDER_SITE_OTHER): Payer: BC Managed Care – PPO | Admitting: Family Medicine

## 2022-05-22 VITALS — BP 144/80 | HR 82 | Temp 97.9°F | Ht 66.5 in | Wt 206.2 lb

## 2022-05-22 DIAGNOSIS — E6609 Other obesity due to excess calories: Secondary | ICD-10-CM

## 2022-05-22 DIAGNOSIS — E781 Pure hyperglyceridemia: Secondary | ICD-10-CM | POA: Diagnosis not present

## 2022-05-22 DIAGNOSIS — R7303 Prediabetes: Secondary | ICD-10-CM

## 2022-05-22 DIAGNOSIS — Z1211 Encounter for screening for malignant neoplasm of colon: Secondary | ICD-10-CM

## 2022-05-22 DIAGNOSIS — Z0000 Encounter for general adult medical examination without abnormal findings: Secondary | ICD-10-CM

## 2022-05-22 DIAGNOSIS — M25562 Pain in left knee: Secondary | ICD-10-CM | POA: Insufficient documentation

## 2022-05-22 DIAGNOSIS — Z1231 Encounter for screening mammogram for malignant neoplasm of breast: Secondary | ICD-10-CM

## 2022-05-22 DIAGNOSIS — R030 Elevated blood-pressure reading, without diagnosis of hypertension: Secondary | ICD-10-CM

## 2022-05-22 DIAGNOSIS — Z6832 Body mass index (BMI) 32.0-32.9, adult: Secondary | ICD-10-CM

## 2022-05-22 NOTE — Assessment & Plan Note (Signed)
Reviewed health habits including diet and exercise and skin cancer prevention Reviewed appropriate screening tests for age  Also reviewed health mt list, fam hx and immunization status , as well as social and family history   See HPI Labs reviewed and ordered Encouraged to schedule her annual eye exam  Mammogram ordered  Encouraged vit D and exercise for bone health  Colonoscopy due since 2020- declines this or other screening currently  Encouraged strength building exercise to help with wt loss

## 2022-05-22 NOTE — Assessment & Plan Note (Addendum)
BP: (!) 144/80   Encouraged work on lifestyle change  Given handout for DASH diet  Encouraged strength building exercise to help wt loss Encouraged her to back off of the [facility] products which can inc bp F/u 1-2 mo  Lab today

## 2022-05-22 NOTE — Assessment & Plan Note (Signed)
Discussed how this problem influences overall health and the risks it imposes  Reviewed plan for weight loss with lower calorie diet (via better food choices and also portion control or program like weight watchers) and exercise building up to or more than 30 minutes 5 days per week including some aerobic activity  Still doing well with diet but has gained wt causing frustration  Suspect loss of muscle after menopause is adding  Discussed plan for strength building exercise Labs today also

## 2022-05-22 NOTE — Assessment & Plan Note (Signed)
A1c today  disc imp of low glycemic diet and wt loss to prevent DM2   Adding exercise would help also

## 2022-05-22 NOTE — Assessment & Plan Note (Signed)
Bilateral Chronic  Limiting exercise  She takes BCs and bp is up   Encouraged her to consider sport med visit

## 2022-05-22 NOTE — Assessment & Plan Note (Signed)
Colonoscopy 06/2013- we called the place she had it done and polyp noted, 5 y recall advised (they sent letter) She declines  Would not elect for colugard due to h/o polyp Could consider ifob at some point Pt declines today

## 2022-05-22 NOTE — Assessment & Plan Note (Signed)
Mammogram ordered °Pt will call to schedule  °

## 2022-05-22 NOTE — Assessment & Plan Note (Signed)
Disc goals for lipids and reasons to control them Rev last labs with pt Rev low sat fat diet in detail  Labs ordered today  Fibrate caused blisters in past Niacin caused diarrhea

## 2022-05-22 NOTE — Progress Notes (Signed)
Subjective:    Patient ID: [name], female    DOB: 12/18/1960, 62 y.o.   MRN: [id number]  HPI Pt presents for annual follow up   Wt Readings from Last 3 Encounters:  05/22/22 206 lb 4 oz (93.6 kg)  01/06/22 196 lb (88.9 kg)  11/29/20 187 lb (84.8 kg)   32.79 kg/m  Vitals:   05/22/22 1423  BP: (!) 146/84  Pulse: 82  Temp: 97.9 F (36.6 C)  SpO2: 97%   Gained weight  She still eats well  Avoids sugar   Bp was elevated at work   Work is physical    Wants to do labs today    Immunization History  Administered Date(s) Administered   Influenza Split 12/02/2010   Influenza,inj,Quad PF,6+ Mos 11/27/2018, 12/23/2019   Pneumococcal Polysaccharide-23 11/27/2018   Td 12/11/1995   Tdap 12/02/2010   Health Maintenance Due  Topic Date Due   OPHTHALMOLOGY EXAM  Never done   MAMMOGRAM  09/14/2020   Diabetic kidney evaluation - Urine ACR  12/14/2020   HEMOGLOBIN A1C  05/29/2021   Diabetic kidney evaluation - eGFR measurement  11/29/2021   FOOT EXAM  11/29/2021    Eye exam: due this year   Mammogram 08/2019- needs one  Self breast exam: no lumps  MGM had breast cancer   Colonoscopy 06/2013 with 5 y recall ? Polyp  Does not think she would ever do another colonoscopy   No pap-had hysterectomy  No gyn problems   Issues with edema  Lasix sent in - 20 mg-helped swelling /just took one dose     Prediabetes  Lab Results  Component Value Date   HGBA1C 5.6 11/29/2020  Due for labs  Took metformin in the past and gave her GI issues    Takes a lot of BC powders   H/o high trig  Lab Results  Component Value Date   CHOL 283 (H) 11/29/2020   HDL 36.20 (L) 11/29/2020   LDLDIRECT 200.0 11/29/2020   TRIG 328.0 (H) 11/29/2020   CHOLHDL 8 11/29/2020     Patient Active Problem List   Diagnosis Date Noted   Preventative health care 05/22/2022   Bilateral knee pain 05/22/2022   Encounter for screening mammogram for breast cancer 11/29/2020   Encounter for  general adult medical examination with abnormal findings 12/23/2019   Prediabetes 05/16/2019   GERD (gastroesophageal reflux disease) 05/16/2017   Blister 03/15/2016   Class 1 obesity due to excess calories with body mass index (BMI) of 32.0 to 32.9 in adult 03/15/2016   Hypertriglyceridemia 03/15/2016   Menopausal symptoms 02/10/2015   Colon cancer screening 04/01/2013   Elevated blood pressure reading 02/22/2007   Past Medical History:  Diagnosis Date   Depressive disorder, not elsewhere classified    Elevated blood pressure reading without diagnosis of hypertension    Leiomyoma of uterus, unspecified    Other (abnormal) findings on radiological examination of breast    Pure hypercholesterolemia    Tobacco use disorder    Past Surgical History:  Procedure Laterality Date   BLADDER SUSPENSION     BUNIONECTOMY     PARTIAL HYSTERECTOMY     one ovary remains   VAGINAL HYSTERECTOMY  2008   also had bladder tacked   Social History   Tobacco Use   Smoking status: Former    Packs/day: .25    Types: Cigarettes   Smokeless tobacco: Never  Substance Use Topics   Alcohol use: No  Alcohol/week: 0.0 standard drinks of alcohol    Comment: once a month   Drug use: No   Family History  Problem Relation Age of Onset   Cancer Mother        lung cancer   COPD Father    Diabetes Father    Breast cancer Maternal Grandmother    Diabetes type I Paternal Grandmother    Allergies  Allergen Reactions   Midol [Aspirin-Cinnamedrine-Caffeine] Shortness Of Breath   Acetaminophen     REACTION: dizzy   Bupropion Hcl     REACTION: made worse   Hydrocodone     n/v   Ibuprofen    Lisinopril     Dizzy    Lopid [Gemfibrozil]     Caused blisters    Metformin And Related     GI Stomach cramping and diarrhea    Niaspan [Niacin Er]     Diarrhea    Penicillins     REACTION: dizzy   Current Outpatient Medications on File Prior to Visit  Medication Sig Dispense Refill    Aspirin-Salicylamide-Caffeine (BC HEADACHE PO) Take by mouth daily as needed.     furosemide (LASIX) 20 MG tablet TAKE 1 TABLET BY MOUTH TWICE A DAY IF NEEDED 14 tablet 0   No current facility-administered medications on file prior to visit.     Review of Systems  Constitutional:  Positive for fatigue. Negative for activity change, appetite change, fever and unexpected weight change.  HENT:  Negative for congestion, ear pain, rhinorrhea, sinus pressure and sore throat.   Eyes:  Negative for pain, redness and visual disturbance.  Respiratory:  Negative for cough, shortness of breath and wheezing.   Cardiovascular:  Negative for chest pain and palpitations.  Gastrointestinal:  Negative for abdominal pain, blood in stool, constipation and diarrhea.  Endocrine: Negative for polydipsia and polyuria.  Genitourinary:  Negative for dysuria, frequency and urgency.  Musculoskeletal:  Positive for arthralgias. Negative for back pain and myalgias.  Skin:  Negative for pallor and rash.  Allergic/Immunologic: Negative for environmental allergies.  Neurological:  Negative for dizziness, syncope and headaches.  Hematological:  Negative for adenopathy. Does not bruise/bleed easily.  Psychiatric/Behavioral:  Negative for decreased concentration and dysphoric mood. The patient is not nervous/anxious.        Objective:   Physical Exam Constitutional:      General: She is not in acute distress.    Appearance: Normal appearance. She is well-developed. She is obese. She is not ill-appearing or diaphoretic.  HENT:     Head: Normocephalic and atraumatic.     Right Ear: Tympanic membrane, ear canal and external ear normal.     Left Ear: Tympanic membrane, ear canal and external ear normal.     Nose: Nose normal. No congestion.     Mouth/Throat:     Mouth: Mucous membranes are moist.     Pharynx: Oropharynx is clear. No posterior oropharyngeal erythema.  Eyes:     General: No scleral icterus.     Extraocular Movements: Extraocular movements intact.     Conjunctiva/sclera: Conjunctivae normal.     Pupils: Pupils are equal, round, and reactive to light.  Neck:     Thyroid: No thyromegaly.     Vascular: No carotid bruit or JVD.  Cardiovascular:     Rate and Rhythm: Normal rate and regular rhythm.     Pulses: Normal pulses.     Heart sounds: Normal heart sounds.     No gallop.  Pulmonary:  Effort: Pulmonary effort is normal. No respiratory distress.     Breath sounds: Normal breath sounds. No wheezing.     Comments: Good air exch Chest:     Chest wall: No tenderness.  Abdominal:     General: Bowel sounds are normal. There is no distension or abdominal bruit.     Palpations: Abdomen is soft. There is no mass.     Tenderness: There is no abdominal tenderness.     Hernia: No hernia is present.  Genitourinary:    Comments: Breast exam: No mass, nodules, thickening, tenderness, bulging, retraction, inflamation, nipple discharge or skin changes noted.  No axillary or clavicular LA.     Musculoskeletal:        General: No tenderness. Normal range of motion.     Cervical back: Normal range of motion and neck supple. No rigidity. No muscular tenderness.     Right lower leg: No edema.     Left lower leg: No edema.     Comments: No kyphosis   Lymphadenopathy:     Cervical: No cervical adenopathy.  Skin:    General: Skin is warm and dry.     Coloration: Skin is not pale.     Findings: No erythema or rash.     Comments: Solar lentigines diffusely  Some scattered sks    Neurological:     Mental Status: She is alert. Mental status is at baseline.     Cranial Nerves: No cranial nerve deficit.     Motor: No abnormal muscle tone.     Coordination: Coordination normal.     Gait: Gait normal.     Deep Tendon Reflexes: Reflexes are normal and symmetric. Reflexes normal.  Psychiatric:        Mood and Affect: Mood normal.        Cognition and Memory: Cognition and memory normal.      Comments: Frustrated mood            Assessment & Plan:   Problem List Items Addressed This Visit       Other   Class 1 obesity due to excess calories with body mass index (BMI) of 32.0 to 32.9 in adult    Discussed how this problem influences overall health and the risks it imposes  Reviewed plan for weight loss with lower calorie diet (via better food choices and also portion control or program like weight watchers) and exercise building up to or more than 30 minutes 5 days per week including some aerobic activity  Still doing well with diet but has gained wt causing frustration  Suspect loss of muscle after menopause is adding  Discussed plan for strength building exercise Labs today also       Colon cancer screening    Colonoscopy 06/2013- we called the place she had it done and polyp noted, 5 y recall advised (they sent letter) She declines  Would not elect for colugard due to h/o polyp Could consider ifob at some point Pt declines today        Elevated blood pressure reading    BP: (!) 144/80   Encouraged work on lifestyle change  Given handout for DASH diet  Encouraged strength building exercise to help wt loss Encouraged her to back off of the [facility] products which can inc bp F/u 1-2 mo  Lab today      Relevant Orders   TSH   Comprehensive metabolic panel   Encounter for screening mammogram for breast cancer  Mammogram ordered Pt will call to schedule       Relevant Orders   MM 3D SCREENING MAMMOGRAM BILATERAL BREAST   Hypertriglyceridemia    Disc goals for lipids and reasons to control them Rev last labs with pt Rev low sat fat diet in detail  Labs ordered today  Fibrate caused blisters in past Niacin caused diarrhea         Prediabetes    A1c today  disc imp of low glycemic diet and wt loss to prevent DM2   Adding exercise would help also      Relevant Orders   Hemoglobin A1c   Preventative health care - Primary    Reviewed health  habits including diet and exercise and skin cancer prevention Reviewed appropriate screening tests for age  Also reviewed health mt list, fam hx and immunization status , as well as social and family history   See HPI Labs reviewed and ordered Encouraged to schedule her annual eye exam  Mammogram ordered  Encouraged vit D and exercise for bone health  Colonoscopy due since 2020- declines this or other screening currently  Encouraged strength building exercise to help with wt loss        Relevant Orders   TSH   Lipid panel   Comprehensive metabolic panel   CBC with Differential/Platelet

## 2022-05-22 NOTE — Patient Instructions (Addendum)
Keep up the great low glycemic diet   Take a look at the DASH eating plan   Follow up in 1-2 months for a blood pressure visit   Add some strength training to your routine, this is important for bone and brain health and can reduce your risk of falls and help your body use insulin properly and regulate weight  Light weights, exercise bands , and internet videos are a good way to start  Yoga (chair or regular), machines , floor exercises or a gym with machines are also good options   If you want to see Dr Patsy Lager here about the knee pain you can make an appt   Labs today   Check in with your insurance to find out what CPT code we need to use for your visit  Also problem codes / anything I can't use   Schedule your mammogram  You have an order for:  []   2D Mammogram  [x]   3D Mammogram  []   Bone Density     Please call for appointment:   [x]   [facility]  [address] [place] [address]  [phone]  []   [facility]   [address]  [place], [place] [address]  [phone]  []   [facility]      [address] [place], [place]        [phone]         []   [facility]  [address] [place], [place]  [phone]  []  [facility]   [address]   [place], [place] [address]  [phone]  []  [facility]  [address] [place], [place] [address] [phone]    Make sure to wear two piece clothing  No lotions powders or deodorants the day of the appointment Make sure to bring picture ID and insurance card.  Bring list of medications you are currently taking including any supplements.   Schedule your screening mammogram through MyChart!   Select [place] imaging sites can now be scheduled through  MyChart.  Log into your MyChart account.  Go to 'Visit' (or 'Appointments' if  on mobile App) --> Schedule an  Appointment  Under 'Select a Reason for Visit' choose the Mammogram  Screening option.  Complete the pre-visit questions  and select the time and place that  best fits your schedule

## 2022-05-23 LAB — COMPREHENSIVE METABOLIC PANEL
ALT: 10 U/L (ref 0–35)
AST: 11 U/L (ref 0–37)
Albumin: 4.4 g/dL (ref 3.5–5.2)
Alkaline Phosphatase: 70 U/L (ref 39–117)
BUN: 19 mg/dL (ref 6–23)
CO2: 25 mEq/L (ref 19–32)
Calcium: 9.5 mg/dL (ref 8.4–10.5)
Chloride: 103 mEq/L (ref 96–112)
Creatinine, Ser: 0.74 mg/dL (ref 0.40–1.20)
GFR: 87.67 mL/min (ref >60.00)
Glucose, Bld: 79 mg/dL (ref 70–99)
Potassium: 3.9 mEq/L (ref 3.5–5.1)
Sodium: 137 mEq/L (ref 135–145)
Total Bilirubin: 0.5 mg/dL (ref 0.2–1.2)
Total Protein: 7 g/dL (ref 6.0–8.3)

## 2022-05-23 LAB — LIPID PANEL
Cholesterol: 283 mg/dL — ABNORMAL HIGH (ref 0–200)
HDL: 40 mg/dL (ref >39.00)
NonHDL: 243.43
Total CHOL/HDL Ratio: 7
Triglycerides: 334 mg/dL — ABNORMAL HIGH (ref 0.0–149.0)
VLDL: 66.8 mg/dL — ABNORMAL HIGH (ref 0.0–40.0)

## 2022-05-23 LAB — CBC WITH DIFFERENTIAL/PLATELET
Basophils Absolute: 0.1 10*3/uL (ref 0.0–0.1)
Basophils Relative: 0.9 % (ref 0.0–3.0)
Eosinophils Absolute: 0.1 10*3/uL (ref 0.0–0.7)
Eosinophils Relative: 1.3 % (ref 0.0–5.0)
HCT: 42.4 % (ref 36.0–46.0)
Hemoglobin: 14.4 g/dL (ref 12.0–15.0)
Lymphocytes Relative: 32.1 % (ref 12.0–46.0)
Lymphs Abs: 3.3 10*3/uL (ref 0.7–4.0)
MCHC: 34 g/dL (ref 30.0–36.0)
MCV: 88.9 fl (ref 78.0–100.0)
Monocytes Absolute: 0.8 10*3/uL (ref 0.1–1.0)
Monocytes Relative: 7.7 % (ref 3.0–12.0)
Neutro Abs: 5.9 10*3/uL (ref 1.4–7.7)
Neutrophils Relative %: 58 % (ref 43.0–77.0)
Platelets: 286 10*3/uL (ref 150.0–400.0)
RBC: 4.77 Mil/uL (ref 3.87–5.11)
RDW: 12.9 % (ref 11.5–15.5)
WBC: 10.1 10*3/uL (ref 4.0–10.5)

## 2022-05-23 LAB — HEMOGLOBIN A1C: Hgb A1c MFr Bld: 6.2 % (ref 4.6–6.5)

## 2022-05-23 LAB — LDL CHOLESTEROL, DIRECT: Direct LDL: 195 mg/dL

## 2022-05-23 LAB — TSH: TSH: 2.09 u[IU]/mL (ref 0.35–5.50)

## 2022-07-24 ENCOUNTER — Ambulatory Visit: Payer: BC Managed Care – PPO | Admitting: Family Medicine

## 2022-08-09 ENCOUNTER — Encounter: Payer: Self-pay | Admitting: Family Medicine

## 2022-08-09 ENCOUNTER — Ambulatory Visit: Payer: BC Managed Care – PPO | Admitting: Family Medicine

## 2022-08-09 VITALS — BP 144/85 | HR 82 | Temp 97.7°F | Ht 66.5 in | Wt 209.1 lb

## 2022-08-09 DIAGNOSIS — L918 Other hypertrophic disorders of the skin: Secondary | ICD-10-CM | POA: Diagnosis not present

## 2022-08-09 DIAGNOSIS — I10 Essential (primary) hypertension: Secondary | ICD-10-CM | POA: Diagnosis not present

## 2022-08-09 MED ORDER — AMLODIPINE BESYLATE 5 MG PO TABS: 5.0000 mg | ORAL_TABLET | Freq: Every day | ORAL | 0 refills | Status: DC

## 2022-08-09 NOTE — Progress Notes (Signed)
Subjective:    Patient ID: [name], female    DOB: [birth year], 61 y.o.   MRN: [id number]  HPI  Wt Readings from Last 3 Encounters:  08/09/22 209 lb 2 oz (94.9 kg)  05/22/22 206 lb 4 oz (93.6 kg)  01/06/22 196 lb (88.9 kg)   33.25 kg/m  Drank lot of water today  Steel toed shoes   Her work does not have air conditioning / hopes it will get fixed soon     Vitals:   08/09/22 1454 08/09/22 1521  BP: (!) 134/90 (!) 144/85  Pulse: 82   Temp: 97.7 F (36.5 C)   SpO2: 97%    Pt presents for follow up of elevated blood pressure Also skin tags -bothersome on chest and neck   BP Readings from Last 3 Encounters:  08/09/22 (!) 144/85  05/22/22 (!) 144/80  01/06/22 (!) 140/70   Lasttime given info  DASH eating plan  Encouraged exercise also   Has been craving salt  Diet is good overall  More salads   Does not check at home   No headache or cp or ankle edema     No sweet tea  Stopped drinking so much milk      Lab Results  Component Value Date   NA 137 05/22/2022   K 3.9 05/22/2022   CO2 25 05/22/2022   GLUCOSE 79 05/22/2022   BUN 19 05/22/2022   CREATININE 0.74 05/22/2022   CALCIUM 9.5 05/22/2022   GFR 87.67 05/22/2022   GFRNONAA >60 05/22/2007    Lab Results  Component Value Date   HGBA1C 6.2 05/22/2022    No longer drinking sweet tea      Patient Active Problem List   Diagnosis Date Noted   Skin tags, multiple acquired 08/09/2022   Preventative health care 05/22/2022   Bilateral knee pain 05/22/2022   Encounter for screening mammogram for breast cancer 11/29/2020   Encounter for general adult medical examination with abnormal findings 12/23/2019   Prediabetes 05/16/2019   GERD (gastroesophageal reflux disease) 05/16/2017   Blister 03/15/2016   Class 1 obesity due to excess calories with body mass index (BMI) of 32.0 to 32.9 in adult 03/15/2016   Hypertriglyceridemia 03/15/2016   Menopausal symptoms 02/10/2015   Colon cancer  screening 04/01/2013   Essential hypertension 02/22/2007   Past Medical History:  Diagnosis Date   Depressive disorder, not elsewhere classified    Elevated blood pressure reading without diagnosis of hypertension    Leiomyoma of uterus, unspecified    Other (abnormal) findings on radiological examination of breast    Pure hypercholesterolemia    Tobacco use disorder    Past Surgical History:  Procedure Laterality Date   BLADDER SUSPENSION     BUNIONECTOMY     PARTIAL HYSTERECTOMY     one ovary remains   VAGINAL HYSTERECTOMY  2008   also had bladder tacked   Social History   Tobacco Use   Smoking status: Former    Current packs/day: 0.25    Types: Cigarettes   Smokeless tobacco: Never  Substance Use Topics   Alcohol use: No    Alcohol/week: 0.0 standard drinks of alcohol    Comment: once a month   Drug use: No   Family History  Problem Relation Age of Onset   Cancer Mother        lung cancer   COPD Father    Diabetes Father    Breast cancer Maternal Grandmother  Diabetes type I Paternal Grandmother    Allergies  Allergen Reactions   Midol [Aspirin-Cinnamedrine-Caffeine] Shortness Of Breath   Acetaminophen     REACTION: dizzy   Bupropion Hcl     REACTION: made worse   Hydrocodone     n/v   Ibuprofen    Lisinopril     Dizzy    Lopid [Gemfibrozil]     Caused blisters    Metformin And Related     GI Stomach cramping and diarrhea    Niaspan [Niacin Er]     Diarrhea    Penicillins     REACTION: dizzy   Current Outpatient Medications on File Prior to Visit  Medication Sig Dispense Refill   Aspirin-Salicylamide-Caffeine (BC HEADACHE PO) Take by mouth daily as needed.     furosemide (LASIX) 20 MG tablet TAKE 1 TABLET BY MOUTH TWICE A DAY IF NEEDED 14 tablet 0   No current facility-administered medications on file prior to visit.    Review of Systems  Constitutional:  Negative for activity change, appetite change, fatigue, fever and unexpected  weight change.  HENT:  Negative for congestion, ear pain, rhinorrhea, sinus pressure and sore throat.   Eyes:  Negative for pain, redness and visual disturbance.  Respiratory:  Negative for cough, shortness of breath and wheezing.   Cardiovascular:  Negative for chest pain and palpitations.  Gastrointestinal:  Negative for abdominal pain, blood in stool, constipation and diarrhea.  Endocrine: Negative for polydipsia and polyuria.  Genitourinary:  Negative for dysuria, frequency and urgency.  Musculoskeletal:  Negative for arthralgias, back pain and myalgias.  Skin:  Negative for pallor and rash.       Some skin tags on neck and under breasts-bothersome  Not red or inflamed   Allergic/Immunologic: Negative for environmental allergies.  Neurological:  Negative for dizziness, syncope and headaches.  Hematological:  Negative for adenopathy. Does not bruise/bleed easily.  Psychiatric/Behavioral:  Negative for decreased concentration and dysphoric mood. The patient is not nervous/anxious.        Objective:   Physical Exam Constitutional:      General: She is not in acute distress.    Appearance: Normal appearance. She is well-developed. She is obese. She is not ill-appearing or diaphoretic.  HENT:     Head: Normocephalic and atraumatic.  Eyes:     Conjunctiva/sclera: Conjunctivae normal.     Pupils: Pupils are equal, round, and reactive to light.  Neck:     Thyroid: No thyromegaly.     Vascular: No carotid bruit or JVD.  Cardiovascular:     Rate and Rhythm: Normal rate and regular rhythm.     Heart sounds: Normal heart sounds.     No gallop.  Pulmonary:     Effort: Pulmonary effort is normal. No respiratory distress.     Breath sounds: Normal breath sounds. No wheezing or rales.  Abdominal:     General: There is no distension or abdominal bruit.     Palpations: Abdomen is soft.  Musculoskeletal:     Cervical back: Normal range of motion and neck supple.     Right lower leg: No  edema.     Left lower leg: No edema.  Lymphadenopathy:     Cervical: No cervical adenopathy.  Skin:    General: Skin is warm and dry.     Coloration: Skin is not pale.     Findings: No rash.     Comments: Some 1-2 mm skin tags on neck and chest and in  between breasts  No redness  Several scars on lateral right breast from old lesions/pimples   Solar lentigines diffusely Some scattered sks   Neurological:     Mental Status: She is alert.     Coordination: Coordination normal.     Deep Tendon Reflexes: Reflexes are normal and symmetric. Reflexes normal.  Psychiatric:        Mood and Affect: Mood normal.           Assessment & Plan:   Problem List Items Addressed This Visit       Cardiovascular and Mediastinum   Essential hypertension - Primary    New dx of HTN BP: (!) 144/85   Discussed lifestyle change/ handout given  Encouraged exercise as tolerated and weight loss   Prescription amlodipine 5 mg daily  Encouraged to call if any side effects  In past lisinopril caused dizziness   Follow up in 2 mo  Handouts given on DASH and HTN        Relevant Medications   amLODipine (NORVASC) 5 MG tablet     Musculoskeletal and Integument   Skin tags, multiple acquired    Very small  Chest and in between breasts  Pt plans to try treating herself since insurance likely will not pay for removal  Discussed importance of keeping areas clean and dry

## 2022-08-09 NOTE — Patient Instructions (Addendum)
Try to eat less processed foods and salt if possible  Start amlodipine 5 mg daily  If any side effects or problems -hold it and let us know    Follow up in 2 months for a blood pressure visit   Take card of yourself

## 2022-08-09 NOTE — Assessment & Plan Note (Signed)
Very small  Chest and in between breasts  Pt plans to try treating herself since insurance likely will not pay for removal  Discussed importance of keeping areas clean and dry

## 2022-08-09 NOTE — Assessment & Plan Note (Addendum)
New dx of HTN BP: (!) 144/85   Discussed lifestyle change/ handout given  Encouraged exercise as tolerated and weight loss   Prescription amlodipine 5 mg daily  Encouraged to call if any side effects  In past lisinopril caused dizziness   Follow up in 2 mo  Handouts given on DASH and HTN

## 2022-09-07 ENCOUNTER — Encounter (INDEPENDENT_AMBULATORY_CARE_PROVIDER_SITE_OTHER): Payer: Self-pay

## 2022-10-02 ENCOUNTER — Inpatient Hospital Stay: Admission: RE | Admit: 2022-10-02 | Payer: BC Managed Care – PPO | Source: Ambulatory Visit

## 2022-10-05 ENCOUNTER — Ambulatory Visit
Admission: RE | Admit: 2022-10-05 | Discharge: 2022-10-05 | Disposition: A | Discharge status: Home / Self Care | Payer: BC Managed Care – PPO | Source: Ambulatory Visit | Attending: Family Medicine | Admitting: Family Medicine

## 2022-10-05 DIAGNOSIS — Z1231 Encounter for screening mammogram for malignant neoplasm of breast: Secondary | ICD-10-CM | POA: Diagnosis present

## 2022-10-10 ENCOUNTER — Ambulatory Visit: Payer: BC Managed Care – PPO | Admitting: Family Medicine

## 2023-02-28 ENCOUNTER — Ambulatory Visit (INDEPENDENT_AMBULATORY_CARE_PROVIDER_SITE_OTHER): Payer: BC Managed Care – PPO | Admitting: Podiatry

## 2023-02-28 ENCOUNTER — Encounter: Payer: Self-pay | Admitting: Podiatry

## 2023-02-28 DIAGNOSIS — M2011 Hallux valgus (acquired), right foot: Secondary | ICD-10-CM | POA: Diagnosis not present

## 2023-02-28 DIAGNOSIS — G5781 Other specified mononeuropathies of right lower limb: Secondary | ICD-10-CM

## 2023-02-28 NOTE — Progress Notes (Signed)
 She presents today for follow-up of her bunion deformity her capsulitis of the second metatarsophalangeal joint her neuroma to the third interdigital space of the right foot.  States that this is really starting to affect my ability perform her daily activities I was going to do this a year ago however because of work I was unable to do so for 1 reason or the other.  She states that now I am having to change shoes which is not helping any longer taking anti-inflammatories which is not helping I just do not know what else to do.  I needed to have this done since we did the first 1 many years ago but I have failed to do so.   ROS: Denies fever chills nausea vomit muscle aches pains calf pain back pain chest pain shortness of breath.   Objective: Vital signs are stable she is alert oriented x 3.  I have reviewed her past medical history medications allergies surgeries and social history.  Pulses are strongly palpable.  Neurologic sensorium is intact she does have a palpable Mulder's click to the third interdigital space of the left foot.  She has hallux abductovalgus deformity with lateral deviation of all of the digits most likely secondary to the hallux.  She also has pain on end range of motion of the second metatarsal phalangeal joint for the capsulitis is.  I reviewed radiographs which demonstrate an increase in the first intermetatarsal angle greater than normal value hallux abductus angle greater than normal value and the second metatarsal protrusion angle of nearly 8 mm.  Assessment: Hallux abductovalgus deformity capsulitis with mild hammertoe deformity second metatarsal phalangeal joint.  And a neuroma third interdigital space.  Plan: Discussed etiology pathology conservative versus surgical therapies.  At this point we consented her today for a [place] bunion repair second metatarsal osteotomy with screws as well as a neurectomy third interdigital space right foot.  We discussed all of these  procedures in great detail she understands this and is amenable to it.  She would like to have this done as soon as possible.  We did discuss the possible postop complications which may include but are not limited to postop pain bleeding swelling infection recurrence need further surgery overcorrection under correction also digit loss limb loss of life she understands this is amenable to it and I will follow-up with her in the near future for surgery.

## 2023-03-05 ENCOUNTER — Telehealth: Payer: Self-pay | Admitting: Urology

## 2023-03-05 NOTE — Telephone Encounter (Signed)
 Received sx papers, called and LM for pt to call back when she is ready to schedule her sx with Dr. Lara Plants.

## 2023-03-07 ENCOUNTER — Telehealth: Payer: Self-pay | Admitting: Podiatry

## 2023-03-07 NOTE — Telephone Encounter (Signed)
DOS-03/30/23  [name] [phone] MET. OSTEOTOMY 2ND [id number] NEURECTOMY 3RD IDS [id number]  [employer] EFFECTIVE DATE- 01/24/20  SPOKE WITH [name] M FROM [employer] AND SHE STATED THAT PRIOR AUTH IS NOT REQUIRED FOR CPT CODES 95621,30865 AND 78469.  CALL REF # : [id number]

## 2023-03-16 ENCOUNTER — Telehealth: Payer: Self-pay | Admitting: Podiatry

## 2023-03-16 NOTE — Telephone Encounter (Signed)
Received FMLA paperwork from patient's employer ....   The paperwork was previously completed by her HR department -- stated her first day of leave was 03/07 - DOS.  Called patient and advised same.  She will pick up the documents from [place] office on Monday 03/19/2023.  Sent e-mail to Maxwell Marion ([email or web address]) and included the [facility] docs and advised same ....     J. Abbott -- 03/16/2023

## 2023-03-27 ENCOUNTER — Telehealth: Payer: Self-pay | Admitting: Podiatry

## 2023-03-27 NOTE — Telephone Encounter (Signed)
 Faxed STD forms to [facility] office attn Nicole 336 [phone]. She received. I called patient at [phone] and advised her she can pick up forms from their office after 1pm today.

## 2023-03-27 NOTE — Telephone Encounter (Signed)
 Patient called in [place] office looking for her FMLA forms. See prev note that they were emailed to [facility] office and patient would pick them up from there. I advised N McGehee that I would complete and fax to [facility] office for patient to pick them up this afternoon.    I will call patient at [phone] to advise when ready so she can pick up.

## 2023-03-28 ENCOUNTER — Other Ambulatory Visit: Payer: Self-pay | Admitting: Podiatry

## 2023-03-28 MED ORDER — ONDANSETRON HCL 4 MG PO TABS: 4.0000 mg | ORAL_TABLET | Freq: Three times a day (TID) | ORAL | 0 refills | Status: DC | PRN

## 2023-03-28 MED ORDER — TRAMADOL HCL 50 MG PO TABS: 50.0000 mg | ORAL_TABLET | Freq: Three times a day (TID) | ORAL | 0 refills | Status: AC | PRN

## 2023-03-28 MED ORDER — CLINDAMYCIN HCL 150 MG PO CAPS: 150.0000 mg | ORAL_CAPSULE | Freq: Three times a day (TID) | ORAL | 0 refills | Status: DC

## 2023-03-30 DIAGNOSIS — G5761 Lesion of plantar nerve, right lower limb: Secondary | ICD-10-CM | POA: Diagnosis not present

## 2023-03-30 DIAGNOSIS — M2011 Hallux valgus (acquired), right foot: Secondary | ICD-10-CM | POA: Diagnosis not present

## 2023-03-30 DIAGNOSIS — M21541 Acquired clubfoot, right foot: Secondary | ICD-10-CM | POA: Diagnosis not present

## 2023-04-04 ENCOUNTER — Ambulatory Visit (INDEPENDENT_AMBULATORY_CARE_PROVIDER_SITE_OTHER)

## 2023-04-04 ENCOUNTER — Telehealth: Payer: Self-pay | Admitting: Podiatry

## 2023-04-04 ENCOUNTER — Encounter: Payer: Self-pay | Admitting: Podiatry

## 2023-04-04 ENCOUNTER — Ambulatory Visit (INDEPENDENT_AMBULATORY_CARE_PROVIDER_SITE_OTHER): Payer: BC Managed Care – PPO | Admitting: Podiatry

## 2023-04-04 DIAGNOSIS — M2011 Hallux valgus (acquired), right foot: Secondary | ICD-10-CM | POA: Diagnosis not present

## 2023-04-04 DIAGNOSIS — Z9889 Other specified postprocedural states: Secondary | ICD-10-CM

## 2023-04-04 DIAGNOSIS — G5781 Other specified mononeuropathies of right lower limb: Secondary | ICD-10-CM

## 2023-04-04 MED ORDER — TRAMADOL HCL 50 MG PO TABS: 50.0000 mg | ORAL_TABLET | Freq: Three times a day (TID) | ORAL | 0 refills | Status: AC | PRN

## 2023-04-04 NOTE — Progress Notes (Signed)
 [name] presents with her son today for her first postop visit date of surgery 03/30/2023 [facility] bunionectomy with screw fixation second metatarsal osteotomy with screw fixation neurectomy third interdigital space.  States that she is feeling good she is really only been taking the medicine as prescribed and has not overdone it.  She states that the ibuprofen is bothering her belly some.  Objective: Vital signs are stable alert oriented x 3.  There is dry sterile dressing intact once removed demonstrates sutures are intact margins well coapted mild edema good range of motion actively and passively of the first metatarsophalangeal joint and second toe.  Foot is rectus in appearance.  No signs of infection.  Radiographs taken today demonstrate osseously mature foot with internal fixation and a single screw to the head of the first metatarsal 2 screws to the head of the second metatarsal.  Assessment: Well-healing surgical foot.  Plan: Redressed today dressed a compressive dressing get back in her boot stay in the boot is much as possible and I will follow-up with her in 1 to 2 weeks for suture removal.  I will refill her pain medication.

## 2023-04-04 NOTE — Telephone Encounter (Signed)
 Updated [facility] forms with RTW date 06/22/23. Refaxed to [facility] office [phone] for the patient.

## 2023-04-09 ENCOUNTER — Telehealth: Payer: Self-pay

## 2023-04-09 ENCOUNTER — Encounter: Payer: Self-pay | Admitting: Podiatry

## 2023-04-09 NOTE — Telephone Encounter (Signed)
 Patient's shower bag leaked and her gauze got wet this afternoon -advised to go ahead and change the dressing - she is scheduled for next Wednesday for her next post op (3/26) should she come in sooner?

## 2023-04-18 ENCOUNTER — Encounter: Payer: Self-pay | Admitting: Podiatry

## 2023-04-18 ENCOUNTER — Telehealth: Payer: Self-pay | Admitting: Podiatry

## 2023-04-18 ENCOUNTER — Ambulatory Visit (INDEPENDENT_AMBULATORY_CARE_PROVIDER_SITE_OTHER): Payer: BC Managed Care – PPO | Admitting: Podiatry

## 2023-04-18 DIAGNOSIS — M2011 Hallux valgus (acquired), right foot: Secondary | ICD-10-CM

## 2023-04-18 DIAGNOSIS — G5781 Other specified mononeuropathies of right lower limb: Secondary | ICD-10-CM

## 2023-04-18 DIAGNOSIS — Z9889 Other specified postprocedural states: Secondary | ICD-10-CM

## 2023-04-18 NOTE — Telephone Encounter (Signed)
 Faxed [employer] [phone] STD forms/notes including RTW date 06/22/23.

## 2023-04-18 NOTE — Progress Notes (Signed)
 She presents today date of surgery 03/30/2023 status post [name] bunionectomy with screw fixation second metatarsal osteotomy and screw fixation and neurectomy third interdigital space.  States that these toes are still nonmeat feeling as she refers to toes 2 3 and 4 of the right foot.  She states is ready to shower and get the stitches out.  Objective: Vital signs are stable alert oriented x 3.  Pulses are palpable.  Mild edema sutures are intact margins are well coapted.  She has good range of motion of the first through the fifth metatarsal phalangeal joints passively as well as actively.  Assessment: Well-healing surgical foot.  Plan: Sutures removed today compression anklet was applied she will be placed in a [place]co shoe and allow her to start washing her foot regularly and applying lotion.  I like to follow-up with her in about 2 weeks at which time we hope to be in a tennis shoe provided swelling has diminished considerably.

## 2023-05-09 ENCOUNTER — Ambulatory Visit (INDEPENDENT_AMBULATORY_CARE_PROVIDER_SITE_OTHER): Admitting: Podiatry

## 2023-05-09 ENCOUNTER — Encounter: Payer: BC Managed Care – PPO | Admitting: Podiatry

## 2023-05-09 ENCOUNTER — Encounter: Payer: Self-pay | Admitting: Podiatry

## 2023-05-09 ENCOUNTER — Ambulatory Visit (INDEPENDENT_AMBULATORY_CARE_PROVIDER_SITE_OTHER)

## 2023-05-09 VITALS — Ht 66.5 in | Wt 209.1 lb

## 2023-05-09 DIAGNOSIS — G5781 Other specified mononeuropathies of right lower limb: Secondary | ICD-10-CM | POA: Diagnosis not present

## 2023-05-09 DIAGNOSIS — M2011 Hallux valgus (acquired), right foot: Secondary | ICD-10-CM

## 2023-05-09 NOTE — Progress Notes (Signed)
  Subjective:  Patient ID: [name], female    DOB: December 16, 1960,  MRN: [id number]  Chief Complaint  Patient presents with   Routine Post Op    Pt is here for 3rd post op visit due to surgery on right foot, pt states her foot is fine still has some pain and tenderness, complains about feeling of ball in the bottom of her foot when she walks.    DOS: 03/30/2023 Procedure: Right foot hallux valgus correction, Weil osteotomy and neuroma excision  63 y.o. female returns for post-op check.  Doing okay does note some tenderness  Review of Systems: Negative except as noted in the HPI. Denies N/V/F/Ch.   Objective:  There were no vitals filed for this visit. Body mass index is 33.25 kg/m. Constitutional Well developed. Well nourished.  Vascular Foot warm and well perfused. Capillary refill normal to all digits.  Calf is soft and supple, no posterior calf or knee pain, negative Homans' sign  Neurologic Normal speech. Oriented to person, place, and time. Epicritic sensation to light touch grossly present bilaterally.  Dermatologic Incision is well-healed.  No hypertrophy.  Mild dorsal contracture of second toe  Orthopedic: No pain to palpation noted about the surgical site.  Mild edema    Multiple view plain film radiographs: Good correction noted there is complete consolidation across osteotomy sites noted Assessment:   1. Neuroma of third interspace of right foot   2. Hav (hallux abducto valgus), right    Plan:  Patient was evaluated and treated and all questions answered.  S/p foot surgery right -Progressing as expected post-operatively.  May gradually resume regular shoe gear at this point.  She has to wear steel toed shoes for work so cannot return to work yet.  Will need better edema reduction and she should continue using the compression sleeve.  Continue ice and elevation with activity.  Return to see us  in 6 weeks for final x-rays.   Return in about 6 weeks (around 06/20/2023)  for post op (new x-rays).

## 2023-06-12 ENCOUNTER — Ambulatory Visit: Payer: Self-pay

## 2023-06-12 NOTE — Telephone Encounter (Signed)
  Chief Complaint: tongue lesion Symptoms: white bumps Frequency: constant X 1 week Pertinent Negatives: Patient denies fever, injury, dental issues, recent illnesses, facial swelling, oral swelling Disposition: [] ED /[] Urgent Care (no appt availability in office) / [x] Appointment(In office/virtual)/ []  [facility]/ [] Home Care/ [] Refused Recommended Disposition /[]  Mobile Bus/ []  Follow-up with PCP Additional Notes:  History of canker sores that resolve within one or two days.  One week ago noticed white painful raised sore on the side of her tongue, size of half pencil eraser. She also has several small flat white spots surrounding this larger raised lesion. She denies injury, trauma, or recent illnesses. No treatments tried at this time. Lesion is not growing but it is also not resolving after one week. She has been avoiding acidic foods. Requesting evaluation. Acute evaluation scheduled with PCP 06/13/23.   Copied from CRM [id number]. Topic: Clinical - Red Word Triage >> Jun 12, 2023  2:34 PM [place] A wrote: [facility] that prompted transfer to Nurse Triage: Patient has a white sore on her tongue for week that is painful Reason for Disposition  4 or more ulcers  Protocols used: Mouth Ulcers-A-AH

## 2023-06-12 NOTE — Telephone Encounter (Signed)
 Will see patient then Agree with ER and UC precautions

## 2023-06-13 ENCOUNTER — Ambulatory Visit (INDEPENDENT_AMBULATORY_CARE_PROVIDER_SITE_OTHER): Admitting: Family Medicine

## 2023-06-13 ENCOUNTER — Encounter: Payer: Self-pay | Admitting: Family Medicine

## 2023-06-13 VITALS — BP 128/78 | HR 107 | Temp 98.6°F | Ht 66.5 in | Wt 228.4 lb

## 2023-06-13 DIAGNOSIS — I10 Essential (primary) hypertension: Secondary | ICD-10-CM | POA: Diagnosis not present

## 2023-06-13 DIAGNOSIS — B370 Candidal stomatitis: Secondary | ICD-10-CM | POA: Diagnosis not present

## 2023-06-13 DIAGNOSIS — K121 Other forms of stomatitis: Secondary | ICD-10-CM

## 2023-06-13 DIAGNOSIS — R600 Localized edema: Secondary | ICD-10-CM

## 2023-06-13 DIAGNOSIS — F4323 Adjustment disorder with mixed anxiety and depressed mood: Secondary | ICD-10-CM | POA: Diagnosis not present

## 2023-06-13 MED ORDER — TRIAMCINOLONE ACETONIDE 0.1 % MT PSTE: 1.0000 | PASTE | Freq: Two times a day (BID) | OROMUCOSAL | 0 refills | Status: DC

## 2023-06-13 MED ORDER — NYSTATIN 100000 UNIT/ML MT SUSP: 5.0000 mL | Freq: Three times a day (TID) | OROMUCOSAL | 0 refills | Status: DC

## 2023-06-13 MED ORDER — FUROSEMIDE 20 MG PO TABS: 20.0000 mg | ORAL_TABLET | Freq: Every day | ORAL | 0 refills | Status: DC | PRN

## 2023-06-13 MED ORDER — FLUOXETINE HCL 10 MG PO CAPS: 10.0000 mg | ORAL_CAPSULE | Freq: Every day | ORAL | 0 refills | Status: DC

## 2023-06-13 NOTE — Assessment & Plan Note (Signed)
 bp in fair control at this time  BP Readings from Last 1 Encounters:  06/13/23 128/78   No changes needed Most recent labs reviewed  Disc lifstyle change with low sodium diet and exercise  Pt tolerates amlodipine  5 mg daily   Wants to add lasix  20 mg daily for edema  Prescription given Follow up 2 wk for re check and labs

## 2023-06-13 NOTE — Assessment & Plan Note (Addendum)
 Right side of tongue - not going away after 2 weeks Pt has history of mouth ulcers  Prescription kenalog  paste to use bid   (also will treat for thrush with nystatin)  Re check at follow up appointment  Update if not starting to improve in a week or if worsening  Low threshold to ref to ENT for eval or bx if not improving  Pt does vape nicotine  Will monitor closely

## 2023-06-13 NOTE — Assessment & Plan Note (Addendum)
 Incidental finding of white coating on tongue  Recently had steroids for foot  Unable to scrape Prescription nystatin oral solution   Instructed to change toothbrush now and in 2 wk

## 2023-06-13 NOTE — Progress Notes (Signed)
 Subjective:    Patient ID: [name], female    DOB: 09-19-1960, 63 y.o.   MRN: [id number]  HPI  Wt Readings from Last 3 Encounters:  06/13/23 228 lb 6 oz (103.6 kg)  05/09/23 209 lb 2.1 oz (94.9 kg)  08/09/22 209 lb 2 oz (94.9 kg)   36.31 kg/m  Vitals:   06/13/23 1221  BP: 128/78  Pulse: (!) 107  Temp: 98.6 F (37 C)  SpO2: 97%   Pt presents to check spot on tongue  Suspects canker sores  Also mood issue  Pedal edema HTN  Per triage call:  History of canker sores that resolve within one or two days.  One week ago noticed white painful raised sore on the side of her tongue, size of half pencil eraser. She also has several small flat white spots surrounding this larger raised lesion. She denies injury, trauma, or recent illnesses. No treatments tried at this time. Lesion is not growing but it is also not resolving after one week. She has been avoiding acidic foods. Requesting evaluation. Acute evaluation scheduled with PCP 06/13/23.   She is prone to these  Usually brought on by acid / tomato sauce or soda   This one is a little different - 2 weeks and not gone yet  No clue what caused  Is prone to biting mouth and grinding teeth  Just had a dental appointment   No diarrhea unless she takes a laxative  No fevers No rash on hands or feet   Needs refill of lasix  for pedal edema Lab Results  Component Value Date   NA 137 05/22/2022   K 3.9 05/22/2022   CO2 25 05/22/2022   GLUCOSE 79 05/22/2022   BUN 19 05/22/2022   CREATININE 0.74 05/22/2022   CALCIUM 9.5 05/22/2022   GFR 87.67 05/22/2022   GFRNONAA >60 05/22/2007   Vapes low nicotine product  No cigarettes  No cigars  No chewing tob or thrush   Mood  More stress lately  Had surgery for neuroma - out of work 12 weeks/ plans to go back June 2 Has been really been messing with her mood   Is tearful and down  Irritable /on edge   Has had depression in the past  Last time -happened after mom passed  away - prozac   Has never done counseling      06/13/2023   12:27 PM 05/22/2022    2:28 PM 12/23/2019    3:20 PM  Depression screen PHQ 2/9  Decreased Interest 3 0 1  Down, Depressed, Hopeless 3 0 0  PHQ - 2 Score 6 0 1  Altered sleeping 3 3 1   Tired, decreased energy 3 3 2   Change in appetite 3 0 0  Feeling bad or failure about yourself  3 0 0  Trouble concentrating 1 0 0  Moving slowly or fidgety/restless 0 0 0  Suicidal thoughts 0 0 0  PHQ-9 Score 19 6 4   Difficult doing work/chores Somewhat difficult Somewhat difficult Not difficult at all      06/13/2023   12:27 PM 05/22/2022    2:28 PM  GAD 7 : Generalized Anxiety Score  Nervous, Anxious, on Edge 3 0  Control/stop worrying 3 0  Worry too much - different things 3 0  Trouble relaxing 1 0  Restless 1 0  Easily annoyed or irritable 3 3  Afraid - awful might happen 3 0  Total GAD 7 Score 17 3  Anxiety Difficulty Somewhat difficult Somewhat difficult        Patient Active Problem List   Diagnosis Date Noted   Reaction, adjustment, with anxious, depressed mood 06/13/2023   Mouth ulcer 06/13/2023   Thrush 06/13/2023   Skin tags, multiple acquired 08/09/2022   Preventative health care 05/22/2022   Bilateral knee pain 05/22/2022   Encounter for screening mammogram for breast cancer 11/29/2020   Encounter for general adult medical examination with abnormal findings 12/23/2019   Prediabetes 05/16/2019   GERD (gastroesophageal reflux disease) 05/16/2017   Blister 03/15/2016   Class 1 obesity due to excess calories with body mass index (BMI) of 32.0 to 32.9 in adult 03/15/2016   Hypertriglyceridemia 03/15/2016   Menopausal symptoms 02/10/2015   Colon cancer screening 04/01/2013   Pedal edema 04/07/2011   Essential hypertension 02/22/2007   Past Medical History:  Diagnosis Date   Depressive disorder, not elsewhere classified    Elevated blood pressure reading without diagnosis of hypertension    Leiomyoma of  uterus, unspecified    Other (abnormal) findings on radiological examination of breast    Pure hypercholesterolemia    Tobacco use disorder    Past Surgical History:  Procedure Laterality Date   BLADDER SUSPENSION     BUNIONECTOMY     PARTIAL HYSTERECTOMY     one ovary remains   VAGINAL HYSTERECTOMY  2008   also had bladder tacked   Social History   Tobacco Use   Smoking status: Former    Current packs/day: 0.25    Types: Cigarettes   Smokeless tobacco: Never  Substance Use Topics   Alcohol use: No    Alcohol/week: 0.0 standard drinks of alcohol    Comment: once a month   Drug use: No   Family History  Problem Relation Age of Onset   Cancer Mother        lung cancer   COPD Father    Diabetes Father    Breast cancer Maternal Grandmother    Diabetes type I Paternal Grandmother    Allergies  Allergen Reactions   Midol [Aspirin-Cinnamedrine-Caffeine] Shortness Of Breath   Acetaminophen     REACTION: dizzy   Bupropion Hcl     REACTION: made worse   Lisinopril      Dizzy    Lopid  [Gemfibrozil ]     Caused blisters    Metformin  And Related     GI Stomach cramping and diarrhea    Niaspan  [Niacin  Er (Antihyperlipidemic)]     Diarrhea    Penicillins Hives    hives   Current Outpatient Medications on File Prior to Visit  Medication Sig Dispense Refill   amLODipine  (NORVASC ) 5 MG tablet Take 1 tablet (5 mg total) by mouth daily. 90 tablet 0   Aspirin-Salicylamide-Caffeine (BC HEADACHE PO) Take by mouth daily as needed.     ondansetron  (ZOFRAN ) 4 MG tablet Take 1 tablet (4 mg total) by mouth every 8 (eight) hours as needed. 20 tablet 0   No current facility-administered medications on file prior to visit.    Review of Systems  Constitutional:  Positive for fatigue. Negative for activity change, appetite change, fever and unexpected weight change.  HENT:  Positive for dental problem and mouth sores. Negative for congestion, ear pain, facial swelling, rhinorrhea,  sinus pressure, sore throat and trouble swallowing.   Eyes:  Negative for pain, redness and visual disturbance.  Respiratory:  Negative for cough, shortness of breath and wheezing.   Cardiovascular:  Negative for chest pain and  palpitations.  Gastrointestinal:  Negative for abdominal pain, blood in stool, constipation and diarrhea.  Endocrine: Negative for polydipsia and polyuria.  Genitourinary:  Negative for dysuria, frequency and urgency.  Musculoskeletal:  Negative for arthralgias, back pain and myalgias.  Skin:  Negative for pallor and rash.  Allergic/Immunologic: Negative for environmental allergies.  Neurological:  Negative for dizziness, syncope and headaches.  Hematological:  Negative for adenopathy. Does not bruise/bleed easily.  Psychiatric/Behavioral:  Positive for dysphoric mood and sleep disturbance. Negative for decreased concentration, self-injury and suicidal ideas. The patient is nervous/anxious.        Objective:   Physical Exam Constitutional:      General: She is not in acute distress.    Appearance: Normal appearance. She is well-developed. She is obese. She is not ill-appearing or diaphoretic.  HENT:     Head: Normocephalic and atraumatic.     Right Ear: External ear normal.     Left Ear: External ear normal.     Nose: Nose normal.     Mouth/Throat:     Comments: White coating on tongue Not scrapable  0.5 cm irregular ulcer with white edge and pink base on right lateral tongue  Mildly tender   Dentition fair  Eyes:     Conjunctiva/sclera: Conjunctivae normal.     Pupils: Pupils are equal, round, and reactive to light.  Neck:     Thyroid : No thyromegaly.     Vascular: No carotid bruit or JVD.  Cardiovascular:     Rate and Rhythm: Normal rate and regular rhythm.     Heart sounds: Normal heart sounds.     No gallop.  Pulmonary:     Effort: Pulmonary effort is normal. No respiratory distress.     Breath sounds: Normal breath sounds. No wheezing or  rales.  Abdominal:     General: There is no distension or abdominal bruit.     Palpations: Abdomen is soft.  Musculoskeletal:     Cervical back: Normal range of motion and neck supple.     Right lower leg: Edema present.     Left lower leg: Edema present.     Comments: Trace ankle edema   Lymphadenopathy:     Cervical: No cervical adenopathy.  Skin:    General: Skin is warm and dry.     Coloration: Skin is not pale.     Findings: No rash.  Neurological:     Mental Status: She is alert.     Coordination: Coordination normal.     Deep Tendon Reflexes: Reflexes are normal and symmetric. Reflexes normal.  Psychiatric:        Attention and Perception: Attention normal.        Mood and Affect: Mood is anxious and depressed. Affect is tearful.        Speech: Speech normal.        Behavior: Behavior normal.        Thought Content: Thought content does not include suicidal ideation.        Cognition and Memory: Cognition and memory normal.     Comments: Candidly discusses symptoms and stressors              Assessment & Plan:   Problem List Items Addressed This Visit       Cardiovascular and Mediastinum   Essential hypertension   bp in fair control at this time  BP Readings from Last 1 Encounters:  06/13/23 128/78   No changes needed Most recent labs reviewed  Disc lifstyle change with low sodium diet and exercise  Pt tolerates amlodipine  5 mg daily   Wants to add lasix  20 mg daily for edema  Prescription given Follow up 2 wk for re check and labs      Relevant Medications   furosemide  (LASIX ) 20 MG tablet     Digestive   Thrush   Incidental finding of white coating on tongue  Recently had steroids for foot  Unable to scrape Prescription nystatin oral solution   Instructed to change toothbrush now and in 2 wk        Relevant Medications   nystatin (MYCOSTATIN) 100000 UNIT/ML suspension   Mouth ulcer - Primary   Right side of tongue - not going away  after 2 weeks Pt has history of mouth ulcers  Prescription kenalog  paste to use bid   (also will treat for thrush with nystatin)  Re check at follow up appointment  Update if not starting to improve in a week or if worsening  Low threshold to ref to ENT for eval or bx if not improving  Pt does vape nicotine  Will monitor closely        Other   Reaction, adjustment, with anxious, depressed mood   More down/tearful and anx lately /also irritable  PHQ 19 Gad7 17  Reviewed stressors/ coping techniques/symptoms/ support sources/ tx options and side effects in detail today  Will start fluoxetine 10 mg daily  Follow up 2 wk  If tolerated will titrate up   Discussed expectations of SSRI medication including time to effectiveness and mechanism of action, also poss of side effects (early and late)- including mental fuzziness, weight or appetite change, nausea and poss of worse dep or anxiety (even suicidal thoughts)  Pt voiced understanding and will stop med and update if this occurs    Consider counseling in future       Pedal edema   Pt wants to take furosemide  daily  Encouraged low sodium diet   Prescription 20 mg  Will follow up 2 wk for re check and labs  Call back and Er precautions noted in detail today

## 2023-06-13 NOTE — Assessment & Plan Note (Signed)
 Pt wants to take furosemide  daily  Encouraged low sodium diet   Prescription 20 mg  Will follow up 2 wk for re check and labs  Call back and Er precautions noted in detail today

## 2023-06-13 NOTE — Assessment & Plan Note (Signed)
 More down/tearful and anx lately [name] irritable  PHQ 19 Gad7 17  Reviewed stressors/ coping techniques/symptoms/ support sources/ tx options and side effects in detail today  Will start fluoxetine 10 mg daily  Follow up 2 wk  If tolerated will titrate up   Discussed expectations of SSRI medication including time to effectiveness and mechanism of action, also poss of side effects (early and late)- including mental fuzziness, weight or appetite change, nausea and poss of worse dep or anxiety (even suicidal thoughts)  Pt voiced understanding and will stop med and update if this occurs    Consider counseling in future

## 2023-06-13 NOTE — Patient Instructions (Addendum)
 For the ulcer in your mouth use the triamcinolone  paste twice daily with q tip  If not much better in a week I want to refer you ENT for evaluation   For thrush - use the nystatin mouthwash as directed - swish 30 seconds and swallow   Change your tooth brush now and then again in 2 weeks to avoid re contamination     I sent in the furosemide  for swelling/fluid retention    Start fluoxetine 10 mg (generic for prozac) once daily  This is for mood/depression/anxiety  If you have intolerable side effects or you feel worse -stop it and let us  know   Follow up after June 2nd for re check of everything

## 2023-06-14 ENCOUNTER — Other Ambulatory Visit: Payer: Self-pay | Admitting: *Deleted

## 2023-06-14 MED ORDER — FUROSEMIDE 20 MG PO TABS: 20.0000 mg | ORAL_TABLET | Freq: Every day | ORAL | 1 refills | Status: AC | PRN

## 2023-06-14 NOTE — Telephone Encounter (Signed)
 [facility] Claudine Cullens sent a fax requesting a new Rx for lasix  be sent in with correct dosing. It has 2 different directions on it now. One for qd prn and one for BID prn, please resend. Thanks

## 2023-06-20 ENCOUNTER — Ambulatory Visit (INDEPENDENT_AMBULATORY_CARE_PROVIDER_SITE_OTHER): Admitting: Podiatry

## 2023-06-20 ENCOUNTER — Encounter: Payer: Self-pay | Admitting: Podiatry

## 2023-06-20 ENCOUNTER — Ambulatory Visit (INDEPENDENT_AMBULATORY_CARE_PROVIDER_SITE_OTHER)

## 2023-06-20 DIAGNOSIS — M2041 Other hammer toe(s) (acquired), right foot: Secondary | ICD-10-CM

## 2023-06-20 DIAGNOSIS — M2011 Hallux valgus (acquired), right foot: Secondary | ICD-10-CM

## 2023-06-20 DIAGNOSIS — M778 Other enthesopathies, not elsewhere classified: Secondary | ICD-10-CM

## 2023-06-20 MED ORDER — METHYLPREDNISOLONE 4 MG PO TBPK: ORAL_TABLET | ORAL | 0 refills | Status: DC

## 2023-06-20 NOTE — Progress Notes (Signed)
 She presents today date of surgery 03/30/2023 right [facility] bunion repair screw fixation second metatarsal osteotomy with screw fixation and neurectomy third interdigital space.  She states that she is doing fine for the most part but has pain with ambulation and extended walking in the second metatarsal phalangeal joint area.  Objective: Vital signs are stable she is alert oriented x 3 all surgical sites have gone on to heal.  She does have some tight scar tissue overlying the second metatarsal phalangeal joint of that right foot.  She has tenderness on direct palpation and end range of motion of the joint.  Does not appear to be associated with internal fixation.  Radiographs taken today demonstrate an osseously mature foot all surgical sites have gone on to heal uneventfully internal fixation is in good position.  Assessment: Well-healing surgical foot status post second metatarsal osteotomy bunion repair and neurectomy.  Capsulitis present.  Plan: Started her on methylprednisolone.  Will follow-up with her in about 4 to 6 weeks.

## 2023-06-26 ENCOUNTER — Encounter: Payer: Self-pay | Admitting: Podiatry

## 2023-06-27 ENCOUNTER — Ambulatory Visit: Admitting: Family Medicine

## 2023-06-27 ENCOUNTER — Encounter: Payer: Self-pay | Admitting: Podiatry

## 2023-07-10 ENCOUNTER — Ambulatory Visit (INDEPENDENT_AMBULATORY_CARE_PROVIDER_SITE_OTHER): Admitting: Family Medicine

## 2023-07-10 ENCOUNTER — Encounter: Payer: Self-pay | Admitting: Family Medicine

## 2023-07-10 ENCOUNTER — Ambulatory Visit: Payer: Self-pay | Admitting: Family Medicine

## 2023-07-10 VITALS — BP 128/78 | HR 81 | Temp 98.4°F | Ht 66.5 in | Wt 231.0 lb

## 2023-07-10 DIAGNOSIS — F4323 Adjustment disorder with mixed anxiety and depressed mood: Secondary | ICD-10-CM | POA: Diagnosis not present

## 2023-07-10 DIAGNOSIS — B370 Candidal stomatitis: Secondary | ICD-10-CM

## 2023-07-10 DIAGNOSIS — K121 Other forms of stomatitis: Secondary | ICD-10-CM | POA: Diagnosis not present

## 2023-07-10 DIAGNOSIS — I10 Essential (primary) hypertension: Secondary | ICD-10-CM

## 2023-07-10 DIAGNOSIS — R600 Localized edema: Secondary | ICD-10-CM

## 2023-07-10 DIAGNOSIS — Z1211 Encounter for screening for malignant neoplasm of colon: Secondary | ICD-10-CM

## 2023-07-10 DIAGNOSIS — K5900 Constipation, unspecified: Secondary | ICD-10-CM | POA: Insufficient documentation

## 2023-07-10 DIAGNOSIS — K5903 Drug induced constipation: Secondary | ICD-10-CM

## 2023-07-10 LAB — BASIC METABOLIC PANEL WITH GFR
BUN: 17 mg/dL (ref 6–23)
CO2: 27 meq/L (ref 19–32)
Calcium: 9.3 mg/dL (ref 8.4–10.5)
Chloride: 103 meq/L (ref 96–112)
Creatinine, Ser: 0.75 mg/dL (ref 0.40–1.20)
GFR: 85.59 mL/min
Glucose, Bld: 149 mg/dL — ABNORMAL HIGH (ref 70–99)
Potassium: 4.2 meq/L (ref 3.5–5.1)
Sodium: 138 meq/L (ref 135–145)

## 2023-07-10 MED ORDER — FLUOXETINE HCL 10 MG PO CAPS: 10.0000 mg | ORAL_CAPSULE | Freq: Every day | ORAL | 3 refills | Status: DC

## 2023-07-10 NOTE — Patient Instructions (Addendum)
 For constipation  Try mirlax = start with one dose daily as directed  See how you do in 4-5 days then titrate to need   Stay with the 10 mg of fluoxetine  daily   Lasix  labs today   Keep up a good fluid intake  Eat fruits/veggies and high fiber supplement   I put the referral in for ENT  Please let us  know if you don't hear in 1-2 weeks to set that up   Add some strength training to your routine, this is important for bone and brain health and can reduce your risk of falls and help your body use insulin properly and regulate weight  Light weights, exercise bands , and internet videos are a good way to start  Yoga (chair or regular), machines , floor exercises or a gym with machines are also good options

## 2023-07-10 NOTE — Assessment & Plan Note (Signed)
 Resolved with nystatin  oral solution

## 2023-07-10 NOTE — Assessment & Plan Note (Signed)
 bp in fair control at this time  BP Readings from Last 1 Encounters:  07/10/23 128/78   No changes needed Most recent labs reviewed  Disc lifstyle change with low sodium diet and exercise  Amlodipine  5 mg daily  Lasix  prn for edema  Lab today

## 2023-07-10 NOTE — Assessment & Plan Note (Signed)
 Improved with prn use of lasix  40 mg (about twice weekly) Encouraged low sodium diet and more water intake  Lab today

## 2023-07-10 NOTE — Assessment & Plan Note (Signed)
 This may be side effect of fluoxetine   Encouraged more produce and fluids  Trial of miralax over the counter to effect   Declines colonoscopy or any other colon cancer screen)

## 2023-07-10 NOTE — Assessment & Plan Note (Signed)
 A friend of hers died from perf colon from a routine colonoscopy Pt declines colonoscopy or any other screening that could lead to one She will let us  know if she changes her mind

## 2023-07-10 NOTE — Assessment & Plan Note (Signed)
 Much improved with fluoxetine  10 mg daily  Some constipation from med but tolerable Reviewed stressors/ coping techniques/symptoms/ support sources/ tx options and side effects in detail today Back to some hobbies Less tearful Wants to stay on this dose

## 2023-07-10 NOTE — Assessment & Plan Note (Signed)
 This is improved with use of triamcinolone  paste  (no more pain) Still not 100% resolved Pt does vape nicotine  Has habit of biting tongue   Will ref to ENT for further eval

## 2023-07-10 NOTE — Progress Notes (Signed)
 Subjective:    Patient ID: [name], female    DOB: 04-26-1960, 63 y.o.   MRN: [id number]  HPI  Wt Readings from Last 3 Encounters:  07/10/23 231 lb (104.8 kg)  06/13/23 228 lb 6 oz (103.6 kg)  05/09/23 209 lb 2.1 oz (94.9 kg)   36.73 kg/m  Vitals:   07/10/23 0945  BP: 128/78  Pulse: 81  Temp: 98.4 F (36.9 C)  SpO2: 98%   Pt presents for  Re check of tongue /ulcer  Follow up for mood  Follow up for swelling and HTN   Last visit presented for ulcer/lesion on right side of tongue (in setting of history of mouth ulcers)  Also thrush  Prescription for kenalog  paste given to use bid on ulcer  and nystatin  for thrush  Better overall  Ulcer area still has a little white to it (much better)  No more pain at all   Noted pt does vape nicotine   HTN bp is stable today  No cp or palpitations or headaches or edema  No side effects to medicines  BP Readings from Last 3 Encounters:  07/10/23 128/78  06/13/23 128/78  08/09/22 (!) 144/85    Amlodipine  5 mg daily  Lasix  20 mg added last visit for pedal edema - she cannot take it every single day  When she takes it /needs it more   2 days per week- takes 2 pills  Does help the swelling   Lab Results  Component Value Date   NA 137 05/22/2022   K 3.9 05/22/2022   CO2 25 05/22/2022   GLUCOSE 79 05/22/2022   BUN 19 05/22/2022   CREATININE 0.74 05/22/2022   CALCIUM 9.5 05/22/2022   GFR 87.67 05/22/2022   GFRNONAA >60 05/22/2007    Mood      07/10/2023    9:53 AM 06/13/2023   12:27 PM 05/22/2022    2:28 PM 12/23/2019    3:20 PM  Depression screen PHQ 2/9  Decreased Interest 0 3 0 1  Down, Depressed, Hopeless 0 3 0 0  PHQ - 2 Score 0 6 0 1  Altered sleeping 3 3 3 1   Tired, decreased energy 3 3 3 2   Change in appetite 0 3 0 0  Feeling bad or failure about yourself  0 3 0 0  Trouble concentrating 0 1 0 0  Moving slowly or fidgety/restless 0 0 0 0  Suicidal thoughts 0 0 0 0  PHQ-9 Score 6 19 6 4   Difficult  doing work/chores Somewhat difficult Somewhat difficult Somewhat difficult Not difficult at all      07/10/2023    9:53 AM 06/13/2023   12:27 PM 05/22/2022    2:28 PM  GAD 7 : Generalized Anxiety Score  Nervous, Anxious, on Edge 0 3 0  Control/stop worrying 0 3 0  Worry too much - different things 0 3 0  Trouble relaxing 0 1 0  Restless 0 1 0  Easily annoyed or irritable 0 3 3  Afraid - awful might happen 0 3 0  Total GAD 7 Score 0 17 3  Anxiety Difficulty Not difficult at all Somewhat difficult Somewhat difficult    We started fluoxetine  10 mg daily for anx/dep mood  Feels much better  Helping tremendously  No longer crying or angry every day   Stress level is not high-is not back to work yet however after foot surgery (still struggling with it)  This is frustrating - a  lot of foot pain   Is back to some of her hobbies- able to get joy out of it   Declines colonoscopy  Too afraid of any screening / too scared       Patient Active Problem List   Diagnosis Date Noted   Constipation 07/10/2023   Reaction, adjustment, with anxious, depressed mood 06/13/2023   Mouth ulcer 06/13/2023   Skin tags, multiple acquired 08/09/2022   Preventative health care 05/22/2022   Bilateral knee pain 05/22/2022   Encounter for screening mammogram for breast cancer 11/29/2020   Encounter for general adult medical examination with abnormal findings 12/23/2019   Prediabetes 05/16/2019   GERD (gastroesophageal reflux disease) 05/16/2017   Blister 03/15/2016   Class 1 obesity due to excess calories with body mass index (BMI) of 32.0 to 32.9 in adult 03/15/2016   Hypertriglyceridemia 03/15/2016   Menopausal symptoms 02/10/2015   Colon cancer screening 04/01/2013   Pedal edema 04/07/2011   Essential hypertension 02/22/2007   Past Medical History:  Diagnosis Date   Depressive disorder, not elsewhere classified    Elevated blood pressure reading without diagnosis of hypertension     Leiomyoma of uterus, unspecified    Other (abnormal) findings on radiological examination of breast    Pure hypercholesterolemia    Tobacco use disorder    Past Surgical History:  Procedure Laterality Date   BLADDER SUSPENSION     BUNIONECTOMY     PARTIAL HYSTERECTOMY     one ovary remains   VAGINAL HYSTERECTOMY  2008   also had bladder tacked   Social History   Tobacco Use   Smoking status: Former    Current packs/day: 0.25    Types: Cigarettes   Smokeless tobacco: Never  Substance Use Topics   Alcohol use: No    Alcohol/week: 0.0 standard drinks of alcohol    Comment: once a month   Drug use: No   Family History  Problem Relation Age of Onset   Cancer Mother        lung cancer   COPD Father    Diabetes Father    Breast cancer Maternal Grandmother    Diabetes type I Paternal Grandmother    Allergies  Allergen Reactions   Midol [Aspirin-Cinnamedrine-Caffeine] Shortness Of Breath   Acetaminophen     REACTION: dizzy   Bupropion Hcl     REACTION: made worse   Lisinopril      Dizzy    Lopid  [Gemfibrozil ]     Caused blisters    Metformin  And Related     GI Stomach cramping and diarrhea    Niaspan  [Niacin  Er (Antihyperlipidemic)]     Diarrhea    Penicillins Hives    hives   Current Outpatient Medications on File Prior to Visit  Medication Sig Dispense Refill   amLODipine  (NORVASC ) 5 MG tablet Take 1 tablet (5 mg total) by mouth daily. 90 tablet 0   Aspirin-Salicylamide-Caffeine (BC HEADACHE PO) Take by mouth daily as needed.     furosemide  (LASIX ) 20 MG tablet Take 1 tablet (20 mg total) by mouth daily as needed for edema. (Patient taking differently: Take 40 mg by mouth daily as needed for edema.) 30 tablet 1   ondansetron  (ZOFRAN ) 4 MG tablet Take 1 tablet (4 mg total) by mouth every 8 (eight) hours as needed. 20 tablet 0   No current facility-administered medications on file prior to visit.    Review of Systems  Constitutional:  Negative for activity  change, appetite change, fatigue, fever  and unexpected weight change.  HENT:  Negative for congestion, ear pain, rhinorrhea, sinus pressure and sore throat.   Eyes:  Negative for pain, redness and visual disturbance.  Respiratory:  Negative for cough, shortness of breath and wheezing.   Cardiovascular:  Negative for chest pain and palpitations.  Gastrointestinal:  Negative for abdominal pain, blood in stool, constipation and diarrhea.  Endocrine: Negative for polydipsia and polyuria.  Genitourinary:  Negative for dysuria, frequency and urgency.  Musculoskeletal:  Negative for arthralgias, back pain and myalgias.  Skin:  Negative for pallor and rash.  Allergic/Immunologic: Negative for environmental allergies.  Neurological:  Negative for dizziness, syncope and headaches.  Hematological:  Negative for adenopathy. Does not bruise/bleed easily.  Psychiatric/Behavioral:  Negative for decreased concentration and dysphoric mood. The patient is not nervous/anxious.        Much improved mood       Objective:   Physical Exam Constitutional:      General: She is not in acute distress.    Appearance: Normal appearance. She is well-developed. She is obese. She is not ill-appearing or diaphoretic.  HENT:     Head: Normocephalic and atraumatic.     Nose: Nose normal.     Mouth/Throat:     Mouth: Mucous membranes are moist.     Pharynx: No oropharyngeal exudate or posterior oropharyngeal erythema.     Comments: White lesion on right lateral tongue is smaller but still present No longer ulcerated   No white coating present on tongue   Eyes:     Conjunctiva/sclera: Conjunctivae normal.     Pupils: Pupils are equal, round, and reactive to light.   Neck:     Thyroid : No thyromegaly.     Vascular: No carotid bruit or JVD.   Cardiovascular:     Rate and Rhythm: Normal rate and regular rhythm.     Heart sounds: Normal heart sounds.     No gallop.  Pulmonary:     Effort: Pulmonary effort is  normal. No respiratory distress.     Breath sounds: Normal breath sounds. No wheezing or rales.  Abdominal:     General: There is no distension or abdominal bruit.     Palpations: Abdomen is soft.   Musculoskeletal:     Cervical back: Normal range of motion and neck supple.     Right lower leg: No edema.     Left lower leg: No edema.  Lymphadenopathy:     Cervical: No cervical adenopathy.   Skin:    General: Skin is warm and dry.     Coloration: Skin is not pale.     Findings: No rash.   Neurological:     Mental Status: She is alert.     Cranial Nerves: No cranial nerve deficit.     Motor: No weakness.     Coordination: Coordination normal.     Deep Tendon Reflexes: Reflexes are normal and symmetric. Reflexes normal.   Psychiatric:        Attention and Perception: Attention normal.        Mood and Affect: Mood normal. Mood is not anxious or depressed.        Speech: Speech normal.        Behavior: Behavior normal.     Comments: Mood is much better Candidly discusses symptoms and stressors             Assessment & Plan:   Problem List Items Addressed This Visit  Cardiovascular and Mediastinum   Essential hypertension   bp in fair control at this time  BP Readings from Last 1 Encounters:  07/10/23 128/78   No changes needed Most recent labs reviewed  Disc lifstyle change with low sodium diet and exercise  Amlodipine  5 mg daily  Lasix  prn for edema  Lab today      Relevant Orders   Basic metabolic panel with GFR     Digestive   RESOLVED: Thrush   Resolved with nystatin  oral solution       Mouth ulcer   This is improved with use of triamcinolone  paste  (no more pain) Still not 100% resolved Pt does vape nicotine  Has habit of biting tongue   Will ref to ENT for further eval       Relevant Orders   Ambulatory referral to ENT     Other   Reaction, adjustment, with anxious, depressed mood - Primary   Much improved with fluoxetine  10 mg  daily  Some constipation from med but tolerable Reviewed stressors/ coping techniques/symptoms/ support sources/ tx options and side effects in detail today Back to some hobbies Less tearful Wants to stay on this dose       Pedal edema   Improved with prn use of lasix  40 mg (about twice weekly) Encouraged low sodium diet and more water intake  Lab today        Constipation   This may be side effect of fluoxetine   Encouraged more produce and fluids  Trial of miralax over the counter to effect   Declines colonoscopy or any other colon cancer screen)       Colon cancer screening   A friend of hers died from perf colon from a routine colonoscopy Pt declines colonoscopy or any other screening that could lead to one She will let us  know if she changes her mind

## 2023-07-18 ENCOUNTER — Encounter: Payer: Self-pay | Admitting: Podiatry

## 2023-07-18 ENCOUNTER — Ambulatory Visit (INDEPENDENT_AMBULATORY_CARE_PROVIDER_SITE_OTHER)

## 2023-07-18 ENCOUNTER — Ambulatory Visit (INDEPENDENT_AMBULATORY_CARE_PROVIDER_SITE_OTHER): Admitting: Podiatry

## 2023-07-18 DIAGNOSIS — G5781 Other specified mononeuropathies of right lower limb: Secondary | ICD-10-CM

## 2023-07-18 DIAGNOSIS — M2041 Other hammer toe(s) (acquired), right foot: Secondary | ICD-10-CM

## 2023-07-18 DIAGNOSIS — M2011 Hallux valgus (acquired), right foot: Secondary | ICD-10-CM

## 2023-07-18 DIAGNOSIS — Z9889 Other specified postprocedural states: Secondary | ICD-10-CM

## 2023-07-18 MED ORDER — INDOMETHACIN 50 MG PO CAPS
50.0000 mg | ORAL_CAPSULE | Freq: Three times a day (TID) | ORAL | 1 refills | Status: DC
Start: 2023-07-18 — End: 2023-10-26

## 2023-07-18 NOTE — Progress Notes (Signed)
 She presents today date of surgery 03/30/2023 right foot Austin bunionectomy with screw fixation second metatarsal osteotomy screw fixation and neurectomy third interdigital space on the right foot.  States when she went back to work her ball of her foot was really just hurting and killing her.  States that she was unable to wear those composite toed shoes while at work for more than 2 to 3 hours without it hurting.  Objective: Vital signs are stable alert and oriented x 3.  There is no erythema just mild edema no cellulitis drainage or odor she does have pain on palpation of the second metatarsal phalangeal joint.  Radiographs taken today demonstrate some osteolysis at the site of the condylectomy medial aspect of the first metatarsal cannot rule out some osteolysis at the head of the second met.  All internal fixation is in good position.  Assessment: Osteolysis continued healing from surgery.  Metatarsalgia  Plan: Start her on indomethacin 50 mg 1 p.o. twice daily.  Also Continue her out of work again for at least another 3 to 4 weeks and request that she follow-up with [facility] for new set of orthotics which will offload the forefoot.  I requested that she bring her paperwork by here so that we can take it or send it to [facility].

## 2023-07-19 DIAGNOSIS — Z0271 Encounter for disability determination: Secondary | ICD-10-CM

## 2023-07-19 NOTE — Telephone Encounter (Signed)
 Recd form from [employer]. Faxed form/note with revised RTW 08/22/23 date to [phone]

## 2023-07-23 ENCOUNTER — Telehealth: Payer: Self-pay | Admitting: Podiatry

## 2023-07-23 NOTE — Telephone Encounter (Signed)
 Patient called she is taking medication Indomethacin  its making her very sick. She would like to try something different. Pharmacy is [facility].

## 2023-08-20 ENCOUNTER — Encounter: Payer: Self-pay | Admitting: Podiatry

## 2023-08-20 ENCOUNTER — Ambulatory Visit (INDEPENDENT_AMBULATORY_CARE_PROVIDER_SITE_OTHER): Admitting: Podiatry

## 2023-08-20 DIAGNOSIS — M778 Other enthesopathies, not elsewhere classified: Secondary | ICD-10-CM | POA: Diagnosis not present

## 2023-08-20 DIAGNOSIS — G8918 Other acute postprocedural pain: Secondary | ICD-10-CM

## 2023-08-20 MED ORDER — TRIAMCINOLONE ACETONIDE 40 MG/ML IJ SUSP
20.0000 mg | Freq: Once | INTRAMUSCULAR | Status: AC
Start: 2023-08-20 — End: 2023-08-20
  Administered 2023-08-20: 20 mg

## 2023-08-20 MED ORDER — CELECOXIB 200 MG PO CAPS
200.0000 mg | ORAL_CAPSULE | Freq: Two times a day (BID) | ORAL | 3 refills | Status: DC
Start: 2023-08-20 — End: 2023-10-26

## 2023-08-20 NOTE — Progress Notes (Signed)
 [name] presents today for follow-up of her right foot.  States that the foot is still very painful and has had no improvement since last visit.  I was not able to take the indomethacin  because it upset my stomach.  She is my toe is the worst part as she points to the second metatarsal phalangeal joint.  Objective: Vital signs are stable she is alert oriented x 3 very little pain on range of motion of the first metatarsophalangeal joint where the bunionectomy was performed however she has pain on direct palpation of the second digit at the metatarsal phalangeal joint dorsally.  Minimally so plantarly.  Assessment postop pain and capsulitis second metatarsophalangeal joint.  Plan: I injected the area of the second metatarsophalangeal joint today with Kenalog  and local anesthetic.  She tolerated procedure well.  We will send her for physical therapy.  She is also getting her orthotics scanned next week.  And she will also start Celebrex  200 mg t capsules twice daily.  Follow-up with her in a month.  She will also need to remain out of work until that time.

## 2023-08-21 ENCOUNTER — Telehealth: Payer: Self-pay | Admitting: Podiatry

## 2023-08-21 DIAGNOSIS — Z0271 Encounter for disability determination: Secondary | ICD-10-CM

## 2023-08-21 NOTE — Telephone Encounter (Signed)
 I believe they have to contact medical records.

## 2023-08-21 NOTE — Telephone Encounter (Signed)
 Recd forms from [employer]. Faxed notes.letter.forms to [phone]. Revised OOW till 09/18/23

## 2023-08-22 NOTE — Telephone Encounter (Signed)
 Informed patient to contact [facility] Medical Records has been provided with contact number.

## 2023-08-31 ENCOUNTER — Ambulatory Visit (INDEPENDENT_AMBULATORY_CARE_PROVIDER_SITE_OTHER)

## 2023-08-31 DIAGNOSIS — M778 Other enthesopathies, not elsewhere classified: Secondary | ICD-10-CM

## 2023-08-31 DIAGNOSIS — M2142 Flat foot [pes planus] (acquired), left foot: Secondary | ICD-10-CM | POA: Diagnosis not present

## 2023-08-31 DIAGNOSIS — M2041 Other hammer toe(s) (acquired), right foot: Secondary | ICD-10-CM

## 2023-08-31 DIAGNOSIS — M2011 Hallux valgus (acquired), right foot: Secondary | ICD-10-CM

## 2023-08-31 DIAGNOSIS — M2141 Flat foot [pes planus] (acquired), right foot: Secondary | ICD-10-CM | POA: Diagnosis not present

## 2023-08-31 DIAGNOSIS — Z9889 Other specified postprocedural states: Secondary | ICD-10-CM

## 2023-09-05 ENCOUNTER — Ambulatory Visit: Attending: Podiatry

## 2023-09-05 DIAGNOSIS — M79671 Pain in right foot: Secondary | ICD-10-CM | POA: Insufficient documentation

## 2023-09-05 DIAGNOSIS — G8918 Other acute postprocedural pain: Secondary | ICD-10-CM | POA: Insufficient documentation

## 2023-09-05 DIAGNOSIS — R262 Difficulty in walking, not elsewhere classified: Secondary | ICD-10-CM | POA: Diagnosis present

## 2023-09-05 NOTE — Therapy (Signed)
 OUTPATIENT PHYSICAL THERAPY THORACOLUMBAR EVALUATION   Patient Name: [name] MRN: [id number] DOB:10-22-1960, 63 y.o., female Today's Date: 09/05/2023  END OF SESSION:  PT End of Session - 09/05/23 1121     Visit Number 1    Number of Visits 17    Date for PT Re-Evaluation 11/02/23    PT Start Time 1121    PT Stop Time 1210    PT Time Calculation (min) 49 min    Activity Tolerance Patient tolerated treatment well    Behavior During Therapy WFL for tasks assessed/performed          Past Medical History:  Diagnosis Date   Depressive disorder, not elsewhere classified    Elevated blood pressure reading without diagnosis of hypertension    Leiomyoma of uterus, unspecified    Other (abnormal) findings on radiological examination of breast    Pure hypercholesterolemia    Tobacco use disorder    Past Surgical History:  Procedure Laterality Date   BLADDER SUSPENSION     BUNIONECTOMY     PARTIAL HYSTERECTOMY     one ovary remains   VAGINAL HYSTERECTOMY  2008   also had bladder tacked   Patient Active Problem List   Diagnosis Date Noted   Constipation 07/10/2023   Reaction, adjustment, with anxious, depressed mood 06/13/2023   Mouth ulcer 06/13/2023   Skin tags, multiple acquired 08/09/2022   Preventative health care 05/22/2022   Bilateral knee pain 05/22/2022   Encounter for screening mammogram for breast cancer 11/29/2020   Encounter for general adult medical examination with abnormal findings 12/23/2019   Prediabetes 05/16/2019   GERD (gastroesophageal reflux disease) 05/16/2017   Blister 03/15/2016   Class 1 obesity due to excess calories with body mass index (BMI) of 32.0 to 32.9 in adult 03/15/2016   Hypertriglyceridemia 03/15/2016   Menopausal symptoms 02/10/2015   Colon cancer screening 04/01/2013   Pedal edema 04/07/2011   Essential hypertension 02/22/2007    PCP:    Randeen Laine LABOR, MD    REFERRING PROVIDER: Verta Royden DASEN, DPM  REFERRING DIAG:  Diagnosis G89.18 (ICD-10-CM) - Post-operative pain   Rationale for Evaluation and Treatment: Rehabilitation  THERAPY DIAG:  Pain in right foot - Plan: PT plan of care cert/re-cert  Difficulty in walking, not elsewhere classified - Plan: PT plan of care cert/re-cert  ONSET DATE:  03/30/2023 right Austin bunion repair screw fixation second metatarsal osteotomy with screw fixation and neurectomy third interdigital space  SUBJECTIVE:  SUBJECTIVE STATEMENT: See pertinent history.  PERTINENT HISTORY:  R foot pain. Pt states she has to wear steel toed shoes for her job. Wearing regular sneakers do not aggravate her foot. 2nd R foot toe bothers her. Had R foot pronation prior to her surgery. Now, her toe knuckle (proximal interphalangeal joint) is sticking up and does not lay flat on the floor. Pt also gets swelling at her dorsal R foot and has to wear a compression sock daily to mitigate it.   PAIN:  Are you having pain? Yes: NPRS scale: 0/10 currently (Pt sitting, non-weightbearing); 3/10 when walking from waiting room to treatment room Pain location: R dorsal foot, especially her R 2nd digit (proximal interphalangeal joint) Pain description: numb, burning,  Aggravating factors: walking, standing Relieving factors: non-weight bearing. Heat .  PRECAUTIONS: None  RED FLAGS: Bowel or bladder incontinence: No and Cauda equina syndrome: No   WEIGHT BEARING RESTRICTIONS: No  FALLS:  Has patient fallen in last 6 months? 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  NEXT MD VISIT: 09/17/2023  OBJECTIVE:  Note: Objective measures were completed at Evaluation unless otherwise noted.  DIAGNOSTIC FINDINGS:    PATIENT SURVEYS:  LEFS  Extreme difficulty/unable (0), Quite a bit of difficulty (1), Moderate difficulty (2), Little difficulty (3), No difficulty (4) Survey date:  09/05/2023  Any of your usual work, housework or school activities 0  2. Usual hobbies, recreational or sporting activities 0  3. Getting into/out of the bath 0  4. Walking between rooms 0  5. Putting on socks/shoes 2  6. Squatting  0  7. Lifting an object, like a bag of groceries from the floor 1  8. Performing light activities around your home 0  9. Performing heavy activities around your home 0  10. Getting into/out of a car 3  11. Walking 2 blocks 0  12. Walking 1 mile 0  13. Going up/down 10 stairs (1 flight) 0  14. Standing for 1 hour 0  15.  sitting for 1 hour 4  16. Running on even ground 0  17. Running on uneven ground 0  18. Making sharp turns while running fast 0  19. Hopping  0  20. Rolling over in bed 2  Score total:  16/80   LEFS 16/80 (09/05/2023)  COGNITION: Overall cognitive status: Within functional limits for tasks assessed     SENSATION: Numbness R dorsal foot.   MUSCLE LENGTH:  POSTURE: B foot pronation in standing  PALPATION: TTP R foot 2nd and 3rd metatarsophalangeal joints    LUMBAR ROM:   AROM eval  Flexion   Extension   Right lateral flexion   Left lateral flexion   Right rotation   Left rotation    (Blank rows = not tested)  LOWER EXTREMITY ROM:     Active  Right eval Left eval  Hip flexion    Hip extension    Hip abduction    Hip adduction    Hip internal rotation    Hip external rotation    Knee flexion    Knee extension    Ankle dorsiflexion 8 (13 AAROM)   Ankle plantarflexion  Ankle inversion     Ankle eversion     (Blank rows = not tested)  LOWER EXTREMITY MMT:    MMT Right eval Left eval  Hip flexion 4 4  Hip extension 3+ 4  Hip abduction 4+ 4+  Hip adduction    Hip internal rotation    Hip external rotation 4 4  Knee flexion    Knee extension    Ankle dorsiflexion (manually resisted) 4- 5  Ankle plantarflexion (manually resisted) 4+ 5  Ankle inversion (manually resisted) 4 4+  Ankle eversion (manually resisted) 4 4+   (Blank rows = not tested)  LUMBAR SPECIAL TESTS:  Slump: R dorsal 3rd and 4rth toe tingling reproduction with slump   FUNCTIONAL TESTS:    GAIT: Distance walked: 30 ft Assistive device utilized: None Level of assistance: Complete Independence Comments: antalgic, decreased stance R LE  TREATMENT DATE: 09/05/2023                                                                                                                               Therapeutic Exercise  R LE neural flossing 10x3  Seated R distal 2nd digit (proximal interphalangeal joint) gentle extension stretch with PT   Improved exercise technique, movement at target joints, use of target muscles after mod verbal, visual, tactile cues.   No R foot pain during gait from treatment room to waiting room after session.   PATIENT EDUCATION:  Education details: there-ex, HEP, POC Person educated: Patient Education method: Explanation, Demonstration, Tactile cues, Verbal cues, and Handouts Education comprehension: verbalized understanding and returned demonstration  HOME EXERCISE PROGRAM: Access Code: [email or web address] URL: [email or web address] Date: 09/05/2023 Prepared by: Emil Glassman  Exercises - LE neural flossing  - 2 x daily - 7 x weekly - 3 sets - 10 reps   Seated R distal 2nd digit (proximal interphalangeal joint) gentle extension stretch manually  ASSESSMENT:  CLINICAL IMPRESSION: Patient is a 63  y.o. female who was seen today for physical therapy evaluation  and treatment for R foot pain. She also presents with altered gait pattern and posture, decreased extension ROM at 2nd digit (proximal interphalangeal joint) which might cause rubbing at that area at her shoe, R LE neural tension with reproduction of paresthesia at R dorsal toes, decreased R ankle and R glute strength, and difficulty performing tasks which involve wearing her steel toed shoes, weight bearing, standing, and walking secondary to R foot pain. Pt will benefit from skilled physical therapy services to address the aforementioned deficits.     OBJECTIVE IMPAIRMENTS: difficulty walking, decreased strength, improper body mechanics, postural dysfunction, and pain.   ACTIVITY LIMITATIONS: carrying, lifting, standing, squatting, and locomotion level  PARTICIPATION LIMITATIONS:   PERSONAL FACTORS: Age, Fitness, Profession, Time since onset of injury/illness/exacerbation, and 3+ comorbidities: depressive disorder, elevated blood pressure reading without diagnosis of hypertension, Tobacco use disorder are also affecting patient's functional outcome.   REHAB POTENTIAL: Fair    CLINICAL DECISION MAKING:  Stable/uncomplicated  EVALUATION COMPLEXITY: Low   GOALS: Goals reviewed with patient? Yes  SHORT TERM GOALS: Target date: 09/14/2023  Pt will be independent with her initial HEP to decrease pain, improve strength, and ability to ambulate and stand more comfortably.  Baseline: Pt has started her initial HEP (09/05/2023) Goal status: INITIAL  LONG TERM GOALS: Target date: 11/02/2023  Pt will have a decrease in R foot pain to 3/10 or less at worst to promote ability to ambulate, stand, and perform work tasks more comfortably.  Baseline: greater than 3/10 at worst (09/05/2023) Goal status: INITIAL  2.  Pt will improve her LEFS score by at least 20 points as a demonstration of improved function.  Baseline: LEFS 16/80 (09/05/2023)  Goal status: INITIAL  3.  Pt will improve her R hip and  ankle strength by at least 1/2 MMT grade to promote ability to ambulate, perform closed chain tasks with less difficulty.  Baseline:  MMT Right eval  Hip flexion 4  Hip extension 3+  Hip abduction 4+  Hip external rotation 4  Ankle dorsiflexion (manually resisted) 4-  Ankle plantarflexion (manually resisted) 4+  Ankle inversion (manually resisted) 4  Ankle eversion (manually resisted) 4   Goal status: INITIAL   PLAN:  PT FREQUENCY: 1-2x/week  PT DURATION: 8 weeks  PLANNED INTERVENTIONS: 97110-Therapeutic exercises, 97530- Therapeutic activity, [id number]- Neuromuscular re-education, 97535- Self Care, [id number]- Manual therapy, [id number]- Gait training, [phone]- Aquatic Therapy, [phone]- Electrical stimulation (unattended), [id number]- Traction (mechanical), [id number]- Ionotophoresis 4mg /ml Dexamethasone, Patient/Family education, Joint mobilization, and Spinal mobilization.  PLAN FOR NEXT SESSION: R LE neural flossing, hip and ankle strengthening, 2nd digit extension ROM, manual techniques, modalities PRN.   [name], PT, DPT 09/05/2023, 12:57 PM

## 2023-09-06 NOTE — Progress Notes (Signed)
 Orthotics   Patient was present and evaluated for Custom molded foot orthotics. Patient will benefit from CFO's to provide total contact to BIL MLA's helping to balance and distribute body weight more evenly across BIL feet helping to reduce plantar pressure and pain. Orthotic will also encourage FF / RF alignment  Patient was scanned today and will return for fitting upon receipt

## 2023-09-10 ENCOUNTER — Ambulatory Visit

## 2023-09-10 DIAGNOSIS — M79671 Pain in right foot: Secondary | ICD-10-CM | POA: Diagnosis not present

## 2023-09-10 DIAGNOSIS — R262 Difficulty in walking, not elsewhere classified: Secondary | ICD-10-CM

## 2023-09-10 NOTE — Therapy (Signed)
 OUTPATIENT PHYSICAL THERAPY TREATMENT   Patient Name: [name] MRN: [id number] DOB:1961-01-02, 63 y.o., female Today's Date: 09/10/2023  END OF SESSION:  PT End of Session - 09/10/23 1119     Visit Number 2    Number of Visits 17    Date for PT Re-Evaluation 11/02/23    PT Start Time 1120    PT Stop Time 1202    PT Time Calculation (min) 42 min    Activity Tolerance Patient tolerated treatment well    Behavior During Therapy WFL for tasks assessed/performed           Past Medical History:  Diagnosis Date   Depressive disorder, not elsewhere classified    Elevated blood pressure reading without diagnosis of hypertension    Leiomyoma of uterus, unspecified    Other (abnormal) findings on radiological examination of breast    Pure hypercholesterolemia    Tobacco use disorder    Past Surgical History:  Procedure Laterality Date   BLADDER SUSPENSION     BUNIONECTOMY     PARTIAL HYSTERECTOMY     one ovary remains   VAGINAL HYSTERECTOMY  2008   also had bladder tacked   Patient Active Problem List   Diagnosis Date Noted   Constipation 07/10/2023   Reaction, adjustment, with anxious, depressed mood 06/13/2023   Mouth ulcer 06/13/2023   Skin tags, multiple acquired 08/09/2022   Preventative health care 05/22/2022   Bilateral knee pain 05/22/2022   Encounter for screening mammogram for breast cancer 11/29/2020   Encounter for general adult medical examination with abnormal findings 12/23/2019   Prediabetes 05/16/2019   GERD (gastroesophageal reflux disease) 05/16/2017   Blister 03/15/2016   Class 1 obesity due to excess calories with body mass index (BMI) of 32.0 to 32.9 in adult 03/15/2016   Hypertriglyceridemia 03/15/2016   Menopausal symptoms 02/10/2015   Colon cancer screening 04/01/2013   Pedal edema 04/07/2011   Essential hypertension 02/22/2007    PCP:    Randeen Laine LABOR, MD    REFERRING PROVIDER: Verta Royden DASEN, DPM  REFERRING DIAG:  Diagnosis G89.18 (ICD-10-CM) - Post-operative pain   Rationale for Evaluation and Treatment: Rehabilitation  THERAPY DIAG:  Pain in right foot  Difficulty in walking, not elsewhere classified  ONSET DATE:  03/30/2023 right [place] bunion repair screw fixation second metatarsal osteotomy with screw fixation and neurectomy third interdigital space  SUBJECTIVE:                                                                                                                                                                                           SUBJECTIVE STATEMENT: R  foot is fine. The L inferior medial knee is bothering her, she has been walking. Pt joined a walking group at her neighborhood. The foot has been better since last session.   PERTINENT HISTORY:  R foot pain. Pt states she has to wear steel toed shoes for her job. Wearing regular sneakers do not aggravate her foot. 2nd R foot toe bothers her. Had R foot pronation prior to her surgery. Now, her toe knuckle (proximal interphalangeal joint) is sticking up and does not lay flat on the floor. Pt also gets swelling at her dorsal R foot and has to wear a compression sock daily to mitigate it.   PAIN:  Are you having pain? Yes: NPRS scale: 0/10 currently (Pt sitting, non-weightbearing); 3/10 when walking from waiting room to treatment room Pain location: R dorsal foot, especially her R 2nd digit (proximal interphalangeal joint) Pain description: numb, burning,  Aggravating factors: walking, standing Relieving factors: non-weight bearing. Heat .  PRECAUTIONS: None  RED FLAGS: Bowel or bladder incontinence: No and Cauda equina syndrome: No   WEIGHT BEARING RESTRICTIONS: No  FALLS:  Has patient fallen in last 6 months? 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  NEXT MD VISIT: 09/17/2023  OBJECTIVE:  Note: Objective measures were completed at Evaluation unless otherwise noted.  DIAGNOSTIC FINDINGS:    PATIENT SURVEYS:  LEFS  Extreme difficulty/unable (0), Quite a bit of difficulty (1), Moderate difficulty (2), Little difficulty (3), No difficulty (4) Survey date:  09/05/2023  Any of your usual work, housework or school activities 0  2. Usual hobbies, recreational or sporting activities 0  3. Getting into/out of the bath 0  4. Walking between rooms 0  5. Putting on socks/shoes 2  6. Squatting  0  7. Lifting an object, like a bag of groceries from the floor 1  8. Performing light activities around your home 0  9. Performing heavy activities around your home 0  10. Getting into/out of a car 3  11. Walking 2 blocks 0  12. Walking 1 mile 0  13. Going up/down 10 stairs (1 flight) 0  14. Standing for 1 hour 0  15.  sitting for 1 hour 4  16. Running on even ground 0  17. Running on uneven ground 0  18. Making sharp turns while running fast 0  19. Hopping  0  20. Rolling over in bed 2  Score total:  16/80   LEFS 16/80 (09/05/2023)  COGNITION: Overall cognitive status: Within functional limits for tasks assessed     SENSATION: Numbness R dorsal foot.   MUSCLE LENGTH:  POSTURE: B foot pronation in standing  PALPATION: TTP R foot 2nd and 3rd metatarsophalangeal joints    LUMBAR ROM:   AROM eval  Flexion   Extension   Right lateral flexion   Left lateral flexion   Right rotation   Left rotation    (Blank rows = not tested)  LOWER EXTREMITY ROM:     Active  Right eval Left eval  Hip flexion    Hip extension    Hip abduction    Hip adduction    Hip internal rotation  Hip external rotation    Knee flexion    Knee extension     Ankle dorsiflexion 8 (13 AAROM)   Ankle plantarflexion    Ankle inversion    Ankle eversion     (Blank rows = not tested)  LOWER EXTREMITY MMT:    MMT Right eval Left eval  Hip flexion 4 4  Hip extension 3+ 4  Hip abduction 4+ 4+  Hip adduction    Hip internal rotation    Hip external rotation 4 4  Knee flexion    Knee extension    Ankle dorsiflexion (manually resisted) 4- 5  Ankle plantarflexion (manually resisted) 4+ 5  Ankle inversion (manually resisted) 4 4+  Ankle eversion (manually resisted) 4 4+   (Blank rows = not tested)  LUMBAR SPECIAL TESTS:  Slump: R dorsal 3rd and 4rth toe tingling reproduction with slump   FUNCTIONAL TESTS:    GAIT: Distance walked: 30 ft Assistive device utilized: None Level of assistance: Complete Independence Comments: antalgic, decreased stance R LE  TREATMENT DATE: 09/10/2023                                                                                                                               Manual therapy  Seated STM R dorsal foot to decrease fascial restrictions  Seated R distal 2nd digit (proximal interphalangeal joint) gentle extension stretch with PT    Therapeutic Exercise  R LE neural flossing 10x3  Seated R ankle circles  Clockwise 10x  Counterclockwise 10x  S/L hip abduction   R 10x3  L 10x3  Bridge 10x2  Side stepping 30 ft to the R and 30 ft to the L for 2 sets  Forward wedding march 30 ft x 2   Improved exercise technique, movement at target joints, use of target muscles after mod verbal, visual, tactile cues.       PATIENT EDUCATION:  Education details: there-ex, HEP, POC Person educated: Patient Education method: Explanation, Demonstration, Tactile cues, Verbal cues, and Handouts Education comprehension: verbalized understanding and returned demonstration  HOME EXERCISE PROGRAM: Access Code: [email or web address] URL: [email or web address] Date: 09/05/2023 Prepared by: Emil Glassman  Exercises - LE neural flossing  - 2 x daily - 7 x weekly - 3 sets - 10 reps   Seated R distal 2nd digit (proximal interphalangeal joint) gentle extension stretch manually  ASSESSMENT:  CLINICAL IMPRESSION: Continued working on improving 2nd digit extension ROM to decrease pressure in shoe. Worked on STM to decrease fascial restrictions R dorsal foot. Worked on glute med and muscle strengthening to help decrease stress to B knees during gait. Pt tolerated session well without aggravation of symptoms. Pt will benefit from continued skilled physical therapy services to decrease pain, improve strength and function.      OBJECTIVE IMPAIRMENTS: difficulty walking, decreased strength, improper body mechanics, postural dysfunction, and pain.   ACTIVITY LIMITATIONS: carrying, lifting, standing, squatting, and locomotion level  PARTICIPATION LIMITATIONS:  PERSONAL FACTORS: Age, Fitness, Profession, Time since onset of injury/illness/exacerbation, and 3+ comorbidities: depressive disorder, elevated blood pressure reading without diagnosis of hypertension, Tobacco use disorder are also affecting patient's functional outcome.   REHAB POTENTIAL: Fair    CLINICAL DECISION MAKING: Stable/uncomplicated  EVALUATION COMPLEXITY: Low   GOALS: Goals reviewed with patient? Yes  SHORT TERM GOALS: Target date: 09/14/2023  Pt will be independent with her initial HEP to decrease pain, improve strength, and ability to ambulate and stand more comfortably.  Baseline: Pt has started her initial HEP (09/05/2023) Goal status: INITIAL  LONG TERM GOALS: Target date: 11/02/2023  Pt will have a decrease in R foot pain to 3/10 or less at worst to promote ability to ambulate, stand, and perform work tasks more comfortably.  Baseline: greater than 3/10 at worst (09/05/2023) Goal status: INITIAL  2.  Pt will improve her LEFS score by at least 20 points as a demonstration of improved function.  Baseline:  LEFS 16/80 (09/05/2023)  Goal status: INITIAL  3.  Pt will improve her R hip and ankle strength by at least 1/2 MMT grade to promote ability to ambulate, perform closed chain tasks with less difficulty.  Baseline:  MMT Right eval  Hip flexion 4  Hip extension 3+  Hip abduction 4+  Hip external rotation 4  Ankle dorsiflexion (manually resisted) 4-  Ankle plantarflexion (manually resisted) 4+  Ankle inversion (manually resisted) 4  Ankle eversion (manually resisted) 4   Goal status: INITIAL   PLAN:  PT FREQUENCY: 1-2x/week  PT DURATION: 8 weeks  PLANNED INTERVENTIONS: 97110-Therapeutic exercises, 97530- Therapeutic activity, [id number]- Neuromuscular re-education, 97535- Self Care, [id number]- Manual therapy, [id number]- Gait training, [phone]- Aquatic Therapy, [phone]- Electrical stimulation (unattended), [id number]- Traction (mechanical), [id number]- Ionotophoresis 4mg /ml Dexamethasone, Patient/Family education, Joint mobilization, and Spinal mobilization.  PLAN FOR NEXT SESSION: R LE neural flossing, hip and ankle strengthening, 2nd digit extension ROM, manual techniques, modalities PRN.   Vladislav Axelson, PT, DPT 09/10/2023, 12:24 PM

## 2023-09-12 ENCOUNTER — Ambulatory Visit

## 2023-09-17 ENCOUNTER — Ambulatory Visit (INDEPENDENT_AMBULATORY_CARE_PROVIDER_SITE_OTHER): Admitting: Podiatry

## 2023-09-17 ENCOUNTER — Encounter: Payer: Self-pay | Admitting: Podiatry

## 2023-09-17 DIAGNOSIS — M778 Other enthesopathies, not elsewhere classified: Secondary | ICD-10-CM | POA: Diagnosis not present

## 2023-09-17 NOTE — Progress Notes (Signed)
 She presents today after going to physical therapy twice stating that they really did nothing for her but have her walk.  She was very disappointed in that.  She states that after physical therapy she went to bed 1 night expecting to wake up the next morning with the severe pain and the pain was completely gone.  Objective: Vital signs are stable she is alert and oriented x 3.  Pulses are palpable.  There is no erythema edema cellulitis drainage or odor he has great range of motion of the first metatarsal and second tarsal phalangeal joints of the right foot.  Assessment: Well-healing surgical foot.  Plan: Follow up with me on an as-needed basis.

## 2023-09-18 ENCOUNTER — Ambulatory Visit

## 2023-09-19 ENCOUNTER — Telehealth: Payer: Self-pay

## 2023-09-19 NOTE — Telephone Encounter (Signed)
 LVM to schedule orthotic fitting/ pu  Balance for orthotics:$0  ORTHOTICS IN [place] [facility]

## 2023-09-20 ENCOUNTER — Ambulatory Visit (INDEPENDENT_AMBULATORY_CARE_PROVIDER_SITE_OTHER): Admitting: Podiatry

## 2023-09-20 DIAGNOSIS — M778 Other enthesopathies, not elsewhere classified: Secondary | ICD-10-CM

## 2023-09-20 DIAGNOSIS — Z9889 Other specified postprocedural states: Secondary | ICD-10-CM

## 2023-09-20 NOTE — Progress Notes (Signed)
 Orthotics were dispensed they are functioning well no acute complaints.  Break-in period was discussed

## 2023-09-21 ENCOUNTER — Ambulatory Visit

## 2023-09-26 ENCOUNTER — Ambulatory Visit

## 2023-09-28 ENCOUNTER — Telehealth: Payer: Self-pay | Admitting: Podiatry

## 2023-09-28 ENCOUNTER — Ambulatory Visit

## 2023-09-28 DIAGNOSIS — Z0271 Encounter for disability determination: Secondary | ICD-10-CM

## 2023-09-28 NOTE — Telephone Encounter (Signed)
 Faxed [facility] [phone] form/notes/letter  RTW 09/25/23*letter with no restrictions

## 2023-09-30 ENCOUNTER — Encounter: Payer: Self-pay | Admitting: Family Medicine

## 2023-09-30 DIAGNOSIS — L918 Other hypertrophic disorders of the skin: Secondary | ICD-10-CM

## 2023-10-01 ENCOUNTER — Ambulatory Visit

## 2023-10-03 ENCOUNTER — Encounter

## 2023-10-08 ENCOUNTER — Encounter

## 2023-10-11 ENCOUNTER — Encounter

## 2023-10-15 ENCOUNTER — Encounter

## 2023-10-17 ENCOUNTER — Encounter

## 2023-10-22 ENCOUNTER — Encounter

## 2023-10-26 ENCOUNTER — Encounter: Payer: Self-pay | Admitting: Family Medicine

## 2023-10-26 ENCOUNTER — Ambulatory Visit (INDEPENDENT_AMBULATORY_CARE_PROVIDER_SITE_OTHER): Admitting: Family Medicine

## 2023-10-26 VITALS — BP 128/86 | HR 94 | Temp 98.7°F | Ht 66.5 in | Wt 241.2 lb

## 2023-10-26 DIAGNOSIS — G5712 Meralgia paresthetica, left lower limb: Secondary | ICD-10-CM

## 2023-10-26 DIAGNOSIS — G5710 Meralgia paresthetica, unspecified lower limb: Secondary | ICD-10-CM | POA: Insufficient documentation

## 2023-10-26 DIAGNOSIS — Z6838 Body mass index (BMI) 38.0-38.9, adult: Secondary | ICD-10-CM

## 2023-10-26 DIAGNOSIS — E66812 Obesity, class 2: Secondary | ICD-10-CM | POA: Diagnosis not present

## 2023-10-26 DIAGNOSIS — R7303 Prediabetes: Secondary | ICD-10-CM | POA: Diagnosis not present

## 2023-10-26 DIAGNOSIS — E6609 Other obesity due to excess calories: Secondary | ICD-10-CM | POA: Insufficient documentation

## 2023-10-26 DIAGNOSIS — Z23 Encounter for immunization: Secondary | ICD-10-CM

## 2023-10-26 MED ORDER — GABAPENTIN 100 MG PO CAPS: 100.0000 mg | ORAL_CAPSULE | Freq: Two times a day (BID) | ORAL | 1 refills | Status: DC

## 2023-10-26 MED ORDER — METFORMIN HCL ER 500 MG PO TB24: 500.0000 mg | ORAL_TABLET | Freq: Two times a day (BID) | ORAL | 0 refills | Status: DC

## 2023-10-26 NOTE — Patient Instructions (Addendum)
 Stay active Walk as much as you can  Add some strength training to your routine, this is important for bone and brain health and can reduce your risk of falls and help your body use insulin properly and regulate weight  Light weights, exercise bands , and internet videos are a good way to start  Yoga (chair or regular), machines , floor exercises or a gym with machines are also good options   Keep eating well   Try metformin  xr 500 mg twice daily  Let's see if this causes less diarrhea than the short acting   Try the gabapentin 100 mg at bedtime If well tolerated after a week then try am and pm  We can titrate up later as needed    Flu shot today    Follow up in 3 months

## 2023-10-26 NOTE — Assessment & Plan Note (Signed)
 Recurrent  Left thigh  Reassuring exam  Worse with recent weight gain after foot surgery  Not improvement with nsaid   Will try gabapentin 100 mg at bedtime-then increase to bid  Update  Possible side effects discussed   Follow up planned

## 2023-10-26 NOTE — Assessment & Plan Note (Signed)
 Bmi of 38.3  Worsened after inactivity following foot surgery  Now is back to work  Eating better  Encouraged to add strength building exercise to help metabolism  Trial of metformin  XR (see if tolerated better than short acting)  Has helped her loose weight in past   Follow up 3 mo   Consider healthy weight center if not helpful

## 2023-10-26 NOTE — Assessment & Plan Note (Signed)
 Plan to try metformin  xr 500 mg bid to see if better tolerated than short acting Follow up 3 mo

## 2023-10-26 NOTE — Progress Notes (Signed)
 Subjective:    Patient ID: [name], female    DOB: February 19, 1960, 63 y.o.   MRN: [id number]  HPI  Wt Readings from Last 3 Encounters:  10/26/23 241 lb 4 oz (109.4 kg)  07/10/23 231 lb (104.8 kg)  06/13/23 228 lb 6 oz (103.6 kg)   38.36 kg/m  Vitals:   10/26/23 1448  BP: 128/86  Pulse: 94  Temp: 98.7 F (37.1 C)  SpO2: 97%     Pt presents for c/o  Burning sensation in left thigh - recurrent / meralgia paresthetica  Weight gain    Burning in left upper thigh is back  Started when she returned to work in [employer] still (job) makes it worse  Also walking    [place] gay helps Massager helps Best to lie on right side   Tried ibuprofen  Tried heat -did not help   Gained weight when she was off feet for foot surgery   In the past took metformin   Helped her with weight loss but the 1000 mg short acting gave her diarrhea   Interested in the XR  With weight gain- joints hurt more   Now job is active  Gets a lot of steps   Has been eating better for 8 weeks Cut out sweet tea  Cut out milk  Lean protein  Lots of raw veggies  Lot of salad Does not like fruit  Stopped eating sweets     Prediabetes  Lab Results  Component Value Date   HGBA1C 6.2 05/22/2022   HGBA1C 5.6 11/29/2020   HGBA1C 6.3 12/12/2019      Patient Active Problem List   Diagnosis Date Noted   Meralgia paresthetica 10/26/2023   Obesity due to excess calories 10/26/2023   Constipation 07/10/2023   Reaction, adjustment, with anxious, depressed mood 06/13/2023   Mouth ulcer 06/13/2023   Skin tags, multiple acquired 08/09/2022   Preventative health care 05/22/2022   Bilateral knee pain 05/22/2022   Encounter for screening mammogram for breast cancer 11/29/2020   Encounter for general adult medical examination with abnormal findings 12/23/2019   Prediabetes 05/16/2019   GERD (gastroesophageal reflux disease) 05/16/2017   Blister 03/15/2016   Class 1 obesity due to excess  calories with body mass index (BMI) of 32.0 to 32.9 in adult 03/15/2016   Hypertriglyceridemia 03/15/2016   Menopausal symptoms 02/10/2015   Colon cancer screening 04/01/2013   Pedal edema 04/07/2011   Essential hypertension 02/22/2007   Past Medical History:  Diagnosis Date   Depressive disorder, not elsewhere classified    Elevated blood pressure reading without diagnosis of hypertension    Leiomyoma of uterus, unspecified    Other (abnormal) findings on radiological examination of breast    Pure hypercholesterolemia    Tobacco use disorder    Past Surgical History:  Procedure Laterality Date   BLADDER SUSPENSION     BUNIONECTOMY     PARTIAL HYSTERECTOMY     one ovary remains   VAGINAL HYSTERECTOMY  2008   also had bladder tacked   Social History   Tobacco Use   Smoking status: Former    Current packs/day: 0.25    Types: Cigarettes   Smokeless tobacco: Never  Substance Use Topics   Alcohol use: No    Alcohol/week: 0.0 standard drinks of alcohol    Comment: once a month   Drug use: No   Family History  Problem Relation Age of Onset   Cancer Mother  lung cancer   COPD Father    Diabetes Father    Breast cancer Maternal Grandmother    Diabetes type I Paternal Grandmother    Allergies  Allergen Reactions   Midol [Aspirin-Cinnamedrine-Caffeine] Shortness Of Breath   Acetaminophen     REACTION: dizzy   Bupropion Hcl     REACTION: made worse   Lisinopril      Dizzy    Lopid  [Gemfibrozil ]     Caused blisters    Niaspan  [Niacin  Er (Antihyperlipidemic)]     Diarrhea    Penicillins Hives    hives   Current Outpatient Medications on File Prior to Visit  Medication Sig Dispense Refill   Aspirin-Salicylamide-Caffeine (BC HEADACHE PO) Take by mouth daily as needed.     furosemide  (LASIX ) 20 MG tablet Take 1 tablet (20 mg total) by mouth daily as needed for edema. (Patient taking differently: Take 40 mg by mouth daily as needed for edema.) 30 tablet 1    No current facility-administered medications on file prior to visit.    Review of Systems  Constitutional:  Positive for fatigue. Negative for activity change, appetite change, fever and unexpected weight change.  HENT:  Negative for congestion, ear pain, rhinorrhea, sinus pressure and sore throat.   Eyes:  Negative for pain, redness and visual disturbance.  Respiratory:  Negative for cough, shortness of breath and wheezing.   Cardiovascular:  Negative for chest pain and palpitations.  Gastrointestinal:  Negative for abdominal pain, blood in stool, constipation and diarrhea.  Endocrine: Negative for polydipsia and polyuria.  Genitourinary:  Negative for dysuria, frequency and urgency.  Musculoskeletal:  Positive for arthralgias. Negative for back pain and myalgias.  Skin:  Negative for pallor and rash.  Allergic/Immunologic: Negative for environmental allergies.  Neurological:  Positive for numbness. Negative for dizziness, tremors, seizures, syncope, facial asymmetry, speech difficulty, weakness, light-headedness and headaches.       Tingling /burning left upper lateral thigh    Hematological:  Negative for adenopathy. Does not bruise/bleed easily.  Psychiatric/Behavioral:  Negative for decreased concentration and dysphoric mood. The patient is not nervous/anxious.        Objective:   Physical Exam Constitutional:      General: She is not in acute distress.    Appearance: Normal appearance. She is well-developed. She is obese. She is not ill-appearing or diaphoretic.  HENT:     Head: Normocephalic and atraumatic.  Eyes:     Conjunctiva/sclera: Conjunctivae normal.     Pupils: Pupils are equal, round, and reactive to light.  Neck:     Thyroid : No thyromegaly.     Vascular: No carotid bruit or JVD.  Cardiovascular:     Rate and Rhythm: Normal rate and regular rhythm.     Heart sounds: Normal heart sounds.     No gallop.  Pulmonary:     Effort: Pulmonary effort is normal.  No respiratory distress.     Breath sounds: Normal breath sounds. No wheezing or rales.  Abdominal:     General: There is no distension or abdominal bruit.     Palpations: Abdomen is soft. There is no mass.     Tenderness: There is no abdominal tenderness. There is no guarding.     Hernia: No hernia is present.     Comments: No tenderness in left groin   Musculoskeletal:     Cervical back: Normal range of motion and neck supple.     Right lower leg: No edema.     Left  lower leg: No edema.  Lymphadenopathy:     Cervical: No cervical adenopathy.  Skin:    General: Skin is warm and dry.     Coloration: Skin is not pale.     Findings: No rash.  Neurological:     Mental Status: She is alert.     Sensory: Sensory deficit present.     Motor: No weakness.     Coordination: Coordination normal.     Gait: Gait normal.     Deep Tendon Reflexes: Reflexes are normal and symmetric. Reflexes normal.     Comments: Oval area of sensory change on left upper lateral thigh  Not tender but describes as tingly/ overly sensitive    Psychiatric:        Mood and Affect: Mood normal.           Assessment & Plan:   Problem List Items Addressed This Visit       Nervous and Auditory   Meralgia paresthetica - Primary   Recurrent  Left thigh  Reassuring exam  Worse with recent weight gain after foot surgery  Not improvement with nsaid   Will try gabapentin 100 mg at bedtime-then increase to bid  Update  Possible side effects discussed   Follow up planned       Relevant Medications   gabapentin (NEURONTIN) 100 MG capsule     Other   Prediabetes   Plan to try metformin  xr 500 mg bid to see if better tolerated than short acting Follow up 3 mo      Obesity due to excess calories   Bmi of 38.3  Worsened after inactivity following foot surgery  Now is back to work  Eating better  Encouraged to add strength building exercise to help metabolism  Trial of metformin  XR (see if  tolerated better than short acting)  Has helped her loose weight in past   Follow up 3 mo   Consider healthy weight center if not helpful      Relevant Medications   metFORMIN  (GLUCOPHAGE -XR) 500 MG 24 hr tablet   Other Visit Diagnoses       Need for influenza vaccination       Relevant Orders   Flu vaccine trivalent PF, 6mos and older(Flulaval,Afluria,Fluarix,Fluzone) (Completed)

## 2023-10-29 ENCOUNTER — Encounter: Payer: Self-pay | Admitting: Family Medicine

## 2023-11-16 NOTE — Telephone Encounter (Signed)
 Per Epic, Referral was faxed to [facility] 10/16/23

## 2023-12-22 ENCOUNTER — Other Ambulatory Visit: Payer: Self-pay | Admitting: Family Medicine

## 2023-12-29 ENCOUNTER — Other Ambulatory Visit: Payer: Self-pay | Admitting: Family Medicine

## 2023-12-31 NOTE — Telephone Encounter (Signed)
 Last filled on 10/26/23 #60 caps/ 1 refill  Last OV was on 10/26/23

## 2024-01-07 ENCOUNTER — Encounter: Payer: Self-pay | Admitting: Family Medicine

## 2024-01-07 ENCOUNTER — Ambulatory Visit: Admitting: Family Medicine

## 2024-01-07 VITALS — BP 144/80 | HR 87 | Temp 98.2°F | Ht 66.5 in | Wt 233.2 lb

## 2024-01-07 DIAGNOSIS — Z6838 Body mass index (BMI) 38.0-38.9, adult: Secondary | ICD-10-CM | POA: Diagnosis not present

## 2024-01-07 DIAGNOSIS — R600 Localized edema: Secondary | ICD-10-CM | POA: Diagnosis not present

## 2024-01-07 DIAGNOSIS — R7303 Prediabetes: Secondary | ICD-10-CM

## 2024-01-07 DIAGNOSIS — L729 Follicular cyst of the skin and subcutaneous tissue, unspecified: Secondary | ICD-10-CM | POA: Diagnosis not present

## 2024-01-07 DIAGNOSIS — E781 Pure hyperglyceridemia: Secondary | ICD-10-CM

## 2024-01-07 DIAGNOSIS — Z1231 Encounter for screening mammogram for malignant neoplasm of breast: Secondary | ICD-10-CM

## 2024-01-07 DIAGNOSIS — G5712 Meralgia paresthetica, left lower limb: Secondary | ICD-10-CM

## 2024-01-07 DIAGNOSIS — E66812 Obesity, class 2: Secondary | ICD-10-CM | POA: Diagnosis not present

## 2024-01-07 DIAGNOSIS — I10 Essential (primary) hypertension: Secondary | ICD-10-CM | POA: Diagnosis not present

## 2024-01-07 MED ORDER — AMLODIPINE BESYLATE 5 MG PO TABS: 5.0000 mg | ORAL_TABLET | Freq: Every day | ORAL | 3 refills | Status: AC

## 2024-01-07 NOTE — Assessment & Plan Note (Signed)
 Discussed how this problem influences overall health and the risks it imposes  Reviewed plan for weight loss with lower calorie diet (via better food choices (lower glycemic and portion control) along with exercise building up to or more than 30 minutes 5 days per week including some aerobic activity and strength training   Lost 8 lb back to work -physical job with walking  Commended

## 2024-01-07 NOTE — Patient Instructions (Signed)
 Get back on your amlodipine  5 mg daily for blood pressure   Follow up here in about 8 weeks for blood pressure visit   Labs today  Keep working on healthy diet and exercise   I think the spot under your arm is an early cyst/pimple type thing Keep very clean with soap and water  Use a warm compress as often as you can and let us  know if not improving in the coming 2 weeks      You have an order for:  [x]   3D Mammogram  []   Bone Density     Please call for appointment:   [x]   [facility]  [address] [place] [address]  [phone]  []   [facility])   [address]  [place], [place] [address]  [phone]  []   [facility]      [address] [place], [place]        [phone]         []   [facility]  [address] [place], [place]  [phone]  []  [facility]   [address]   [place], [place] [address]  [phone]  []  [facility]  [address] [place], [place] [address] [phone]    Make sure to wear two piece clothing  No lotions powders or deodorants the day of the appointment Make sure to bring picture ID and insurance card.  Bring list of medications you are currently taking including any supplements.   Schedule your screening mammogram through MyChart!   Select [facility] imaging sites can now be scheduled through MyChart.  Log into your MyChart account.  Go to Visit (or Appointments if  on mobile App) --> Schedule an  Appointment  Under Select a Reason for Visit choose the Mammogram  Screening option.  Complete the pre-visit questions  and select the time and place that  best fits your schedule

## 2024-01-07 NOTE — Assessment & Plan Note (Signed)
 Takes lasix  40 mg on weekends as tolerated  Normal exam today

## 2024-01-07 NOTE — Assessment & Plan Note (Signed)
 Tiny 2 mm lump in right axilla consistent with early cyst /ingrown hair   Recommend soap /water-keep very clean Warm compresses as often as possible Watch for growth/pain /redness or drainage Update if not starting to improve in a week or if worsening   Call back and Er precautions noted in detail today

## 2024-01-07 NOTE — Assessment & Plan Note (Signed)
 Improved  Did not tolerate gabapentin  but no longer needs it   Lost weight back to work/physical job

## 2024-01-07 NOTE — Assessment & Plan Note (Signed)
 A1c today  On metformin  xr 500 mg bid and tolerating well  disc imp of low glycemic diet and wt loss to prevent DM2

## 2024-01-07 NOTE — Assessment & Plan Note (Signed)
 Mammogram ordered Pt will call to schedule

## 2024-01-07 NOTE — Assessment & Plan Note (Signed)
 Disc goals for lipids and reasons to control them Rev last labs with pt Rev low sat fat diet in detail  Lab today  Allergic to fibrate Intol to niacin -GI Eating better   On metformin  for glucose- may help trig also  Lab today

## 2024-01-07 NOTE — Assessment & Plan Note (Signed)
 bp not at goal BP Readings from Last 1 Encounters:  01/07/24 (!) 144/80   No changes needed Most recent labs reviewed  Disc lifstyle change with low sodium diet and exercise  Pt stopped her amlodipine  for no reason (? Ran out and forgot to refil)   Sent to pharmacy amlodipine  5 mg daily  Update if any side effects Follow up 8 wk or earlier if needed   Continue working on weight loss

## 2024-01-07 NOTE — Progress Notes (Signed)
 Subjective:    Patient ID: [name], female    DOB: 1960/08/30, 63 y.o.   MRN: [id number]  HPI  Wt Readings from Last 3 Encounters:  01/07/24 233 lb 4 oz (105.8 kg)  10/26/23 241 lb 4 oz (109.4 kg)  07/10/23 231 lb (104.8 kg)   37.08 kg/m  Vitals:   01/07/24 1418  BP: (!) 144/80  Pulse: 87  Temp: 98.2 F (36.8 C)  SpO2: 100%    Pt presents for  Follow up of chronic health problems  HTN Pedal edema  Prediabetes Obesity  Hyperlipidemia/triglycerides  Meralgia paresthetica  Lump in axilla   Noted lump 2 d ago Not sore  Due for mammogram    HTN bp is stable today  No cp or palpitations or headaches or edema  No side effects to medicines  BP Readings from Last 3 Encounters:  01/07/24 (!) 144/80  10/26/23 128/86  07/10/23 128/78     Lab Results  Component Value Date   NA 138 07/10/2023   K 4.2 07/10/2023   CO2 27 07/10/2023   GLUCOSE 149 (H) 07/10/2023   BUN 17 07/10/2023   CREATININE 0.75 07/10/2023   CALCIUM 9.3 07/10/2023   GFR 85.59 07/10/2023   GFRNONAA >60 05/22/2007   Amlodipine  in past -stopped it (was taking in June)  May have ran out  Lasix  prn edema 40 mg -takes on weekends   Meralgia paresthetica -that is better  Last visit prescription gabapentin  100 mg at bedtime  Tried gabapentin  - made her nauseated  Used voltaren gel-that helped some   Doing well with healthy diet  Stopped all soda including diet  Thought they were causing mouth ulcers (due to acid?)   Had that checked and had biopsies    Prediabetes Lab Results  Component Value Date   HGBA1C 6.2 05/22/2022   HGBA1C 5.6 11/29/2020   HGBA1C 6.3 12/12/2019   Taking metformin  xr 500 mg bid  On since October   No side effects  Takes with food    Lipids High triglycerides  Lab Results  Component Value Date   CHOL 283 (H) 05/22/2022   HDL 40.00 05/22/2022   LDLDIRECT 195.0 05/22/2022   TRIG 334.0 (H) 05/22/2022   CHOLHDL 7 05/22/2022   Allergic to  fibrate Niacin  caused diarrhea  Trying to treat with lifestyle change   Less fatty foods  Craves sugar- tries not to eat a lot of chocolate - not every day   Is back to work  More exercise Walks miles and miles at work   Found a lump under right arm   Patient Active Problem List   Diagnosis Date Noted   Skin cyst 01/07/2024   Meralgia paresthetica 10/26/2023   Obesity due to excess calories 10/26/2023   Constipation 07/10/2023   Reaction, adjustment, with anxious, depressed mood 06/13/2023   Mouth ulcer 06/13/2023   Skin tags, multiple acquired 08/09/2022   Preventative health care 05/22/2022   Bilateral knee pain 05/22/2022   Encounter for screening mammogram for breast cancer 11/29/2020   Encounter for general adult medical examination with abnormal findings 12/23/2019   Prediabetes 05/16/2019   GERD (gastroesophageal reflux disease) 05/16/2017   Blister 03/15/2016   Class 1 obesity due to excess calories with body mass index (BMI) of 32.0 to 32.9 in adult 03/15/2016   Hypertriglyceridemia 03/15/2016   Colon cancer screening 04/01/2013   Pedal edema 04/07/2011   Essential hypertension 02/22/2007   Past Medical History:  Diagnosis Date  Depressive disorder, not elsewhere classified    Elevated blood pressure reading without diagnosis of hypertension    Leiomyoma of uterus, unspecified    Other (abnormal) findings on radiological examination of breast    Pure hypercholesterolemia    Tobacco use disorder    Past Surgical History:  Procedure Laterality Date   BLADDER SUSPENSION     BUNIONECTOMY     PARTIAL HYSTERECTOMY     one ovary remains   VAGINAL HYSTERECTOMY  2008   also had bladder tacked   Social History[1] Family History  Problem Relation Age of Onset   Cancer Mother        lung cancer   COPD Father    Diabetes Father    Breast cancer Maternal Grandmother    Diabetes type I Paternal Grandmother    Allergies[2] Medications Ordered Prior to  Encounter[3]  Review of Systems  Constitutional:  Negative for activity change, appetite change, fatigue, fever and unexpected weight change.  HENT:  Negative for congestion, ear pain, rhinorrhea, sinus pressure and sore throat.   Eyes:  Negative for pain, redness and visual disturbance.  Respiratory:  Negative for cough, shortness of breath and wheezing.   Cardiovascular:  Negative for chest pain and palpitations.  Gastrointestinal:  Negative for abdominal pain, blood in stool, constipation and diarrhea.  Endocrine: Negative for polydipsia and polyuria.       Craves chocolate/sweets   Genitourinary:  Negative for dysuria, frequency and urgency.  Musculoskeletal:  Positive for arthralgias. Negative for back pain and myalgias.  Skin:  Negative for pallor and rash.  Allergic/Immunologic: Negative for environmental allergies.  Neurological:  Negative for dizziness, syncope and headaches.       Right foot tingling/burning   Meralgia area on thigh is improved   Hematological:  Negative for adenopathy. Does not bruise/bleed easily.  Psychiatric/Behavioral:  Negative for decreased concentration and dysphoric mood. The patient is not nervous/anxious.        Objective:   Physical Exam Constitutional:      General: She is not in acute distress.    Appearance: Normal appearance. She is well-developed. She is obese. She is not ill-appearing or diaphoretic.  HENT:     Head: Normocephalic and atraumatic.  Eyes:     Conjunctiva/sclera: Conjunctivae normal.     Pupils: Pupils are equal, round, and reactive to light.  Neck:     Thyroid : No thyromegaly.     Vascular: No carotid bruit or JVD.  Cardiovascular:     Rate and Rhythm: Normal rate and regular rhythm.     Heart sounds: Normal heart sounds.     No gallop.  Pulmonary:     Effort: Pulmonary effort is normal. No respiratory distress.     Breath sounds: Normal breath sounds. No stridor. No wheezing, rhonchi or rales.  Abdominal:      General: There is no distension or abdominal bruit.     Palpations: Abdomen is soft.  Musculoskeletal:     Cervical back: Normal range of motion and neck supple.     Right lower leg: No edema.     Left lower leg: No edema.  Lymphadenopathy:     Cervical: No cervical adenopathy.  Skin:    General: Skin is warm and dry.     Coloration: Skin is not pale.     Findings: No rash.  Neurological:     Mental Status: She is alert.     Coordination: Coordination normal.     Deep Tendon Reflexes:  Reflexes are normal and symmetric. Reflexes normal.  Psychiatric:        Mood and Affect: Mood normal.           Assessment & Plan:   Problem List Items Addressed This Visit       Cardiovascular and Mediastinum   Essential hypertension - Primary   bp not at goal BP Readings from Last 1 Encounters:  01/07/24 (!) 144/80   No changes needed Most recent labs reviewed  Disc lifstyle change with low sodium diet and exercise  Pt stopped her amlodipine  for no reason (? Ran out and forgot to refil)   Sent to pharmacy amlodipine  5 mg daily  Update if any side effects Follow up 8 wk or earlier if needed   Continue working on weight loss       Relevant Medications   amLODipine  (NORVASC ) 5 MG tablet   Other Relevant Orders   LDL cholesterol, direct   CBC with Differential/Platelet   Comprehensive metabolic panel with GFR   Lipid Panel   TSH     Nervous and Auditory   Meralgia paresthetica   Improved  Did not tolerate gabapentin  but no longer needs it   Lost weight back to work/physical job         Musculoskeletal and Integument   Skin cyst   Tiny 2 mm lump in right axilla consistent with early cyst /ingrown hair   Recommend soap /water-keep very clean Warm compresses as often as possible Watch for growth/pain /redness or drainage Update if not starting to improve in a week or if worsening   Call back and Er precautions noted in detail today          Other   Prediabetes    A1c today  On metformin  xr 500 mg bid and tolerating well  disc imp of low glycemic diet and wt loss to prevent DM2       Relevant Orders   Hemoglobin A1c   Pedal edema   Takes lasix  40 mg on weekends as tolerated  Normal exam today      Obesity due to excess calories   Discussed how this problem influences overall health and the risks it imposes  Reviewed plan for weight loss with lower calorie diet (via better food choices (lower glycemic and portion control) along with exercise building up to or more than 30 minutes 5 days per week including some aerobic activity and strength training   Lost 8 lb back to work -physical job with walking  Commended       Hypertriglyceridemia   Disc goals for lipids and reasons to control them Rev last labs with pt Rev low sat fat diet in detail  Lab today  Allergic to fibrate Intol to niacin -GI Eating better   On metformin  for glucose- may help trig also  Lab today      Relevant Medications   amLODipine  (NORVASC ) 5 MG tablet   Other Relevant Orders   Lipid Panel   Encounter for screening mammogram for breast cancer   Mammogram ordered  Pt will call to schedule       Relevant Orders   MM 3D SCREENING MAMMOGRAM BILATERAL BREAST      [1]  Social History Tobacco Use   Smoking status: Former    Current packs/day: 0.25    Types: Cigarettes   Smokeless tobacco: Never  Substance Use Topics   Alcohol use: No    Alcohol/week: 0.0 standard drinks of alcohol  Comment: once a month   Drug use: No  [2]  Allergies Allergen Reactions   Midol [Aspirin-Cinnamedrine-Caffeine] Shortness Of Breath   Acetaminophen     REACTION: dizzy   Bupropion Hcl     REACTION: made worse   Gabapentin      Nausea    Lisinopril      Dizzy    Lopid  [Gemfibrozil ]     Caused blisters    Niaspan  [Niacin  Er (Antihyperlipidemic)]     Diarrhea    Penicillins Hives    hives  [3]  Current Outpatient Medications on File Prior to Visit   Medication Sig Dispense Refill   Aspirin-Salicylamide-Caffeine (BC HEADACHE PO) Take by mouth daily as needed.     furosemide  (LASIX ) 20 MG tablet Take 1 tablet (20 mg total) by mouth daily as needed for edema. 30 tablet 1   metFORMIN  (GLUCOPHAGE -XR) 500 MG 24 hr tablet Take 1 tablet (500 mg total) by mouth 2 (two) times daily with a meal. 180 tablet 0   No current facility-administered medications on file prior to visit.

## 2024-01-08 ENCOUNTER — Ambulatory Visit: Payer: Self-pay | Admitting: Family Medicine

## 2024-01-08 LAB — COMPREHENSIVE METABOLIC PANEL WITH GFR
ALT: 18 U/L (ref 0–35)
AST: 15 U/L (ref 5–37)
Albumin: 4.7 g/dL (ref 3.5–5.2)
Alkaline Phosphatase: 83 U/L (ref 39–117)
BUN: 22 mg/dL (ref 6–23)
CO2: 25 meq/L (ref 19–32)
Calcium: 9.5 mg/dL (ref 8.4–10.5)
Chloride: 101 meq/L (ref 96–112)
Creatinine, Ser: 0.81 mg/dL (ref 0.40–1.20)
GFR: 77.77 mL/min (ref >60.00)
Glucose, Bld: 92 mg/dL (ref 70–99)
Potassium: 4 meq/L (ref 3.5–5.1)
Sodium: 136 meq/L (ref 135–145)
Total Bilirubin: 0.3 mg/dL (ref 0.2–1.2)
Total Protein: 7.1 g/dL (ref 6.0–8.3)

## 2024-01-08 LAB — CBC WITH DIFFERENTIAL/PLATELET
Basophils Absolute: 0.1 K/uL (ref 0.0–0.1)
Basophils Relative: 1.4 % (ref 0.0–3.0)
Eosinophils Absolute: 0.1 K/uL (ref 0.0–0.7)
Eosinophils Relative: 1.1 % (ref 0.0–5.0)
HCT: 40.4 % (ref 36.0–46.0)
Hemoglobin: 13.6 g/dL (ref 12.0–15.0)
Lymphocytes Relative: 32.6 % (ref 12.0–46.0)
Lymphs Abs: 3.4 K/uL (ref 0.7–4.0)
MCHC: 33.6 g/dL (ref 30.0–36.0)
MCV: 87.2 fl (ref 78.0–100.0)
Monocytes Absolute: 0.7 K/uL (ref 0.1–1.0)
Monocytes Relative: 6.9 % (ref 3.0–12.0)
Neutro Abs: 6 K/uL (ref 1.4–7.7)
Neutrophils Relative %: 58 % (ref 43.0–77.0)
Platelets: 344 K/uL (ref 150.0–400.0)
RBC: 4.64 Mil/uL (ref 3.87–5.11)
RDW: 13.7 % (ref 11.5–15.5)
WBC: 10.4 K/uL (ref 4.0–10.5)

## 2024-01-08 LAB — LIPID PANEL
Cholesterol: 278 mg/dL — ABNORMAL HIGH (ref 28–200)
HDL: 37.4 mg/dL — ABNORMAL LOW (ref >39.00)
LDL Cholesterol: 167 mg/dL — ABNORMAL HIGH (ref 0–99)
NonHDL: 240.17
Total CHOL/HDL Ratio: 7
Triglycerides: 366 mg/dL — ABNORMAL HIGH (ref 0.0–149.0)
VLDL: 73.2 mg/dL — ABNORMAL HIGH (ref 0.0–40.0)

## 2024-01-08 LAB — TSH: TSH: 2.23 u[IU]/mL (ref 0.35–5.50)

## 2024-01-08 LAB — LDL CHOLESTEROL, DIRECT: Direct LDL: 196 mg/dL

## 2024-01-08 LAB — HEMOGLOBIN A1C: Hgb A1c MFr Bld: 6.6 % — ABNORMAL HIGH (ref 4.6–6.5)

## 2024-01-08 MED ORDER — METFORMIN HCL ER 500 MG PO TB24: 1000.0000 mg | ORAL_TABLET | Freq: Two times a day (BID) | ORAL | 0 refills | Status: DC

## 2024-01-09 ENCOUNTER — Ambulatory Visit (INDEPENDENT_AMBULATORY_CARE_PROVIDER_SITE_OTHER)

## 2024-01-09 ENCOUNTER — Ambulatory Visit: Admitting: Podiatry

## 2024-01-09 DIAGNOSIS — M19071 Primary osteoarthritis, right ankle and foot: Secondary | ICD-10-CM

## 2024-01-09 MED ORDER — PREGABALIN 75 MG PO CAPS: 75.0000 mg | ORAL_CAPSULE | Freq: Two times a day (BID) | ORAL | 2 refills | Status: AC

## 2024-01-09 NOTE — Progress Notes (Signed)
 Roniya presents today states she still having problems with the second metatarsal phalangeal joint even after Dr. Tobie had injected it last visit.  She states that it is getting very hard to bend and is hard to stay on my feet all day at work.  She also goes on to say that she has tried every topical cream that she knows to try from pain stopped the Voltaren nothing seems to work for her.  Objective: Vital signs are stable she is alert and oriented x 3.  Pulses are palpable.  She has tenderness on end range of motion of the second metatarsal phalangeal joint.  Radiographs taken today demonstrate bone to bone contact screws are still intact but she is obviously lost her cartilage in that joint.  Assessment: Osteoarthritis second metatarsal phalangeal joint of the right foot.  Plan: Discussed etiology pathology conservative surgical therapies discussed arthroplasty with interposition of the tendon.  She states that she cannot do surgery at this point in time.  So I recommended pain patches I also provided her with silicone forefoot pads.

## 2024-01-22 MED ORDER — METFORMIN HCL ER 500 MG PO TB24: 1000.0000 mg | ORAL_TABLET | Freq: Two times a day (BID) | ORAL | 1 refills | Status: AC

## 2024-02-18 ENCOUNTER — Encounter

## 2024-02-28 ENCOUNTER — Ambulatory Visit
Admission: RE | Admit: 2024-02-28 | Discharge: 2024-02-28 | Disposition: A | Source: Ambulatory Visit | Attending: Family Medicine | Admitting: Family Medicine

## 2024-02-28 DIAGNOSIS — Z1231 Encounter for screening mammogram for malignant neoplasm of breast: Secondary | ICD-10-CM

## 2024-03-03 ENCOUNTER — Ambulatory Visit: Admitting: Family Medicine
